# Patient Record
Sex: Male | Born: 1940
Health system: Southern US, Community
[De-identification: ages and names within clinical notes are randomized; demographics above are authoritative.]

## PROBLEM LIST (undated history)

## (undated) DIAGNOSIS — E785 Hyperlipidemia, unspecified: Secondary | ICD-10-CM

## (undated) DIAGNOSIS — I2510 Atherosclerotic heart disease of native coronary artery without angina pectoris: Secondary | ICD-10-CM

## (undated) DIAGNOSIS — Z9989 Dependence on other enabling machines and devices: Secondary | ICD-10-CM

## (undated) DIAGNOSIS — Z955 Presence of coronary angioplasty implant and graft: Secondary | ICD-10-CM

## (undated) DIAGNOSIS — G43909 Migraine, unspecified, not intractable, without status migrainosus: Secondary | ICD-10-CM

## (undated) DIAGNOSIS — I480 Paroxysmal atrial fibrillation: Secondary | ICD-10-CM

## (undated) DIAGNOSIS — E119 Type 2 diabetes mellitus without complications: Secondary | ICD-10-CM

## (undated) DIAGNOSIS — Z87448 Personal history of other diseases of urinary system: Secondary | ICD-10-CM

## (undated) DIAGNOSIS — N400 Enlarged prostate without lower urinary tract symptoms: Secondary | ICD-10-CM

## (undated) DIAGNOSIS — Z95820 Peripheral vascular angioplasty status with implants and grafts: Secondary | ICD-10-CM

## (undated) DIAGNOSIS — Z8719 Personal history of other diseases of the digestive system: Secondary | ICD-10-CM

## (undated) DIAGNOSIS — I10 Essential (primary) hypertension: Secondary | ICD-10-CM

## (undated) DIAGNOSIS — K219 Gastro-esophageal reflux disease without esophagitis: Secondary | ICD-10-CM

## (undated) DIAGNOSIS — Z8709 Personal history of other diseases of the respiratory system: Secondary | ICD-10-CM

## (undated) DIAGNOSIS — G4733 Obstructive sleep apnea (adult) (pediatric): Secondary | ICD-10-CM

## (undated) DIAGNOSIS — Z950 Presence of cardiac pacemaker: Secondary | ICD-10-CM

## (undated) HISTORY — PX: APPENDECTOMY: SHX54

## (undated) HISTORY — PX: CARDIAC ELECTROPHYSIOLOGY STUDY AND ABLATION: SHX1294

## (undated) HISTORY — PX: BACK SURGERY: SHX140

## (undated) HISTORY — PX: CHOLECYSTECTOMY: SHX55

## (undated) HISTORY — PX: PROSTATE SURGERY: SHX751

---
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
## 1994-05-24 HISTORY — PX: LUMBAR DISC SURGERY: SHX700

## 1998-12-04 ENCOUNTER — Emergency Department (HOSPITAL_COMMUNITY): Admission: EM | Admit: 1998-12-04 | Discharge: 1998-12-04 | Payer: Self-pay | Admitting: Emergency Medicine

## 1998-12-04 ENCOUNTER — Encounter: Payer: Self-pay | Admitting: Emergency Medicine

## 2000-06-21 ENCOUNTER — Encounter: Admission: RE | Admit: 2000-06-21 | Discharge: 2000-06-21 | Payer: Self-pay | Admitting: Cardiology

## 2000-06-21 ENCOUNTER — Encounter: Payer: Self-pay | Admitting: Cardiology

## 2000-06-24 DIAGNOSIS — Z955 Presence of coronary angioplasty implant and graft: Secondary | ICD-10-CM

## 2000-06-24 DIAGNOSIS — I10 Essential (primary) hypertension: Secondary | ICD-10-CM

## 2000-06-24 DIAGNOSIS — I2510 Atherosclerotic heart disease of native coronary artery without angina pectoris: Secondary | ICD-10-CM

## 2000-06-24 HISTORY — PX: CORONARY ANGIOPLASTY WITH STENT PLACEMENT: SHX49

## 2000-06-24 HISTORY — DX: Presence of coronary angioplasty implant and graft: Z95.5

## 2000-06-24 HISTORY — DX: Essential (primary) hypertension: I10

## 2000-06-24 HISTORY — DX: Atherosclerotic heart disease of native coronary artery without angina pectoris: I25.10

## 2000-06-28 ENCOUNTER — Inpatient Hospital Stay (HOSPITAL_COMMUNITY): Admission: RE | Admit: 2000-06-28 | Discharge: 2000-06-29 | Payer: Self-pay | Admitting: Cardiology

## 2000-07-31 ENCOUNTER — Encounter: Payer: Self-pay | Admitting: *Deleted

## 2000-07-31 ENCOUNTER — Inpatient Hospital Stay (HOSPITAL_COMMUNITY): Admission: EM | Admit: 2000-07-31 | Discharge: 2000-08-01 | Payer: Self-pay | Admitting: *Deleted

## 2001-02-03 ENCOUNTER — Encounter (INDEPENDENT_AMBULATORY_CARE_PROVIDER_SITE_OTHER): Payer: Self-pay | Admitting: Specialist

## 2001-02-03 ENCOUNTER — Ambulatory Visit (HOSPITAL_COMMUNITY): Admission: RE | Admit: 2001-02-03 | Discharge: 2001-02-03 | Payer: Self-pay | Admitting: Gastroenterology

## 2001-03-02 ENCOUNTER — Encounter: Payer: Self-pay | Admitting: Cardiology

## 2001-03-02 ENCOUNTER — Ambulatory Visit (HOSPITAL_COMMUNITY): Admission: RE | Admit: 2001-03-02 | Discharge: 2001-03-02 | Payer: Self-pay | Admitting: Cardiology

## 2001-06-02 ENCOUNTER — Encounter: Payer: Self-pay | Admitting: Emergency Medicine

## 2001-06-02 ENCOUNTER — Inpatient Hospital Stay (HOSPITAL_COMMUNITY): Admission: EM | Admit: 2001-06-02 | Discharge: 2001-06-05 | Payer: Self-pay | Admitting: Emergency Medicine

## 2001-08-05 ENCOUNTER — Emergency Department (HOSPITAL_COMMUNITY): Admission: EM | Admit: 2001-08-05 | Discharge: 2001-08-05 | Payer: Self-pay | Admitting: Emergency Medicine

## 2001-08-05 ENCOUNTER — Encounter: Payer: Self-pay | Admitting: Emergency Medicine

## 2001-12-13 ENCOUNTER — Encounter: Admission: RE | Admit: 2001-12-13 | Discharge: 2001-12-13 | Payer: Self-pay | Admitting: Cardiology

## 2001-12-13 ENCOUNTER — Encounter: Payer: Self-pay | Admitting: Cardiology

## 2002-10-16 ENCOUNTER — Encounter: Payer: Self-pay | Admitting: Cardiology

## 2002-10-16 ENCOUNTER — Ambulatory Visit (HOSPITAL_COMMUNITY): Admission: RE | Admit: 2002-10-16 | Discharge: 2002-10-16 | Payer: Self-pay | Admitting: Cardiology

## 2003-08-19 ENCOUNTER — Ambulatory Visit (HOSPITAL_COMMUNITY): Admission: RE | Admit: 2003-08-19 | Discharge: 2003-08-19 | Payer: Self-pay | Admitting: Urology

## 2003-08-19 ENCOUNTER — Ambulatory Visit (HOSPITAL_BASED_OUTPATIENT_CLINIC_OR_DEPARTMENT_OTHER): Admission: RE | Admit: 2003-08-19 | Discharge: 2003-08-19 | Payer: Self-pay | Admitting: Urology

## 2003-08-19 ENCOUNTER — Encounter (INDEPENDENT_AMBULATORY_CARE_PROVIDER_SITE_OTHER): Payer: Self-pay | Admitting: Specialist

## 2003-11-27 ENCOUNTER — Ambulatory Visit (HOSPITAL_BASED_OUTPATIENT_CLINIC_OR_DEPARTMENT_OTHER): Admission: RE | Admit: 2003-11-27 | Discharge: 2003-11-27 | Payer: Self-pay | Admitting: Urology

## 2003-11-27 ENCOUNTER — Encounter (INDEPENDENT_AMBULATORY_CARE_PROVIDER_SITE_OTHER): Payer: Self-pay | Admitting: *Deleted

## 2003-11-27 ENCOUNTER — Encounter (INDEPENDENT_AMBULATORY_CARE_PROVIDER_SITE_OTHER): Payer: Self-pay | Admitting: Specialist

## 2003-11-27 ENCOUNTER — Ambulatory Visit (HOSPITAL_COMMUNITY): Admission: RE | Admit: 2003-11-27 | Discharge: 2003-11-27 | Payer: Self-pay | Admitting: Urology

## 2003-12-17 ENCOUNTER — Inpatient Hospital Stay (HOSPITAL_COMMUNITY): Admission: EM | Admit: 2003-12-17 | Discharge: 2003-12-19 | Payer: Self-pay | Admitting: Emergency Medicine

## 2004-04-29 ENCOUNTER — Encounter (INDEPENDENT_AMBULATORY_CARE_PROVIDER_SITE_OTHER): Payer: Self-pay | Admitting: Specialist

## 2004-04-29 ENCOUNTER — Ambulatory Visit (HOSPITAL_COMMUNITY): Admission: RE | Admit: 2004-04-29 | Discharge: 2004-04-29 | Payer: Self-pay | Admitting: Gastroenterology

## 2005-01-29 IMAGING — CR DG CHEST 1V PORT
1 series · 1 of 1 positions shown · non-contrast
Comparison: none

CLINICAL DATA: Palpitations/atrial fibrillation.
 CHEST PORTABLE ONE VIEW
 Comparison 08/19/03.

 The heart size and mediastinal contours are within normal limits. The lungs are clear.
 IMPRESSION
 No acute disease.

[view not recorded]
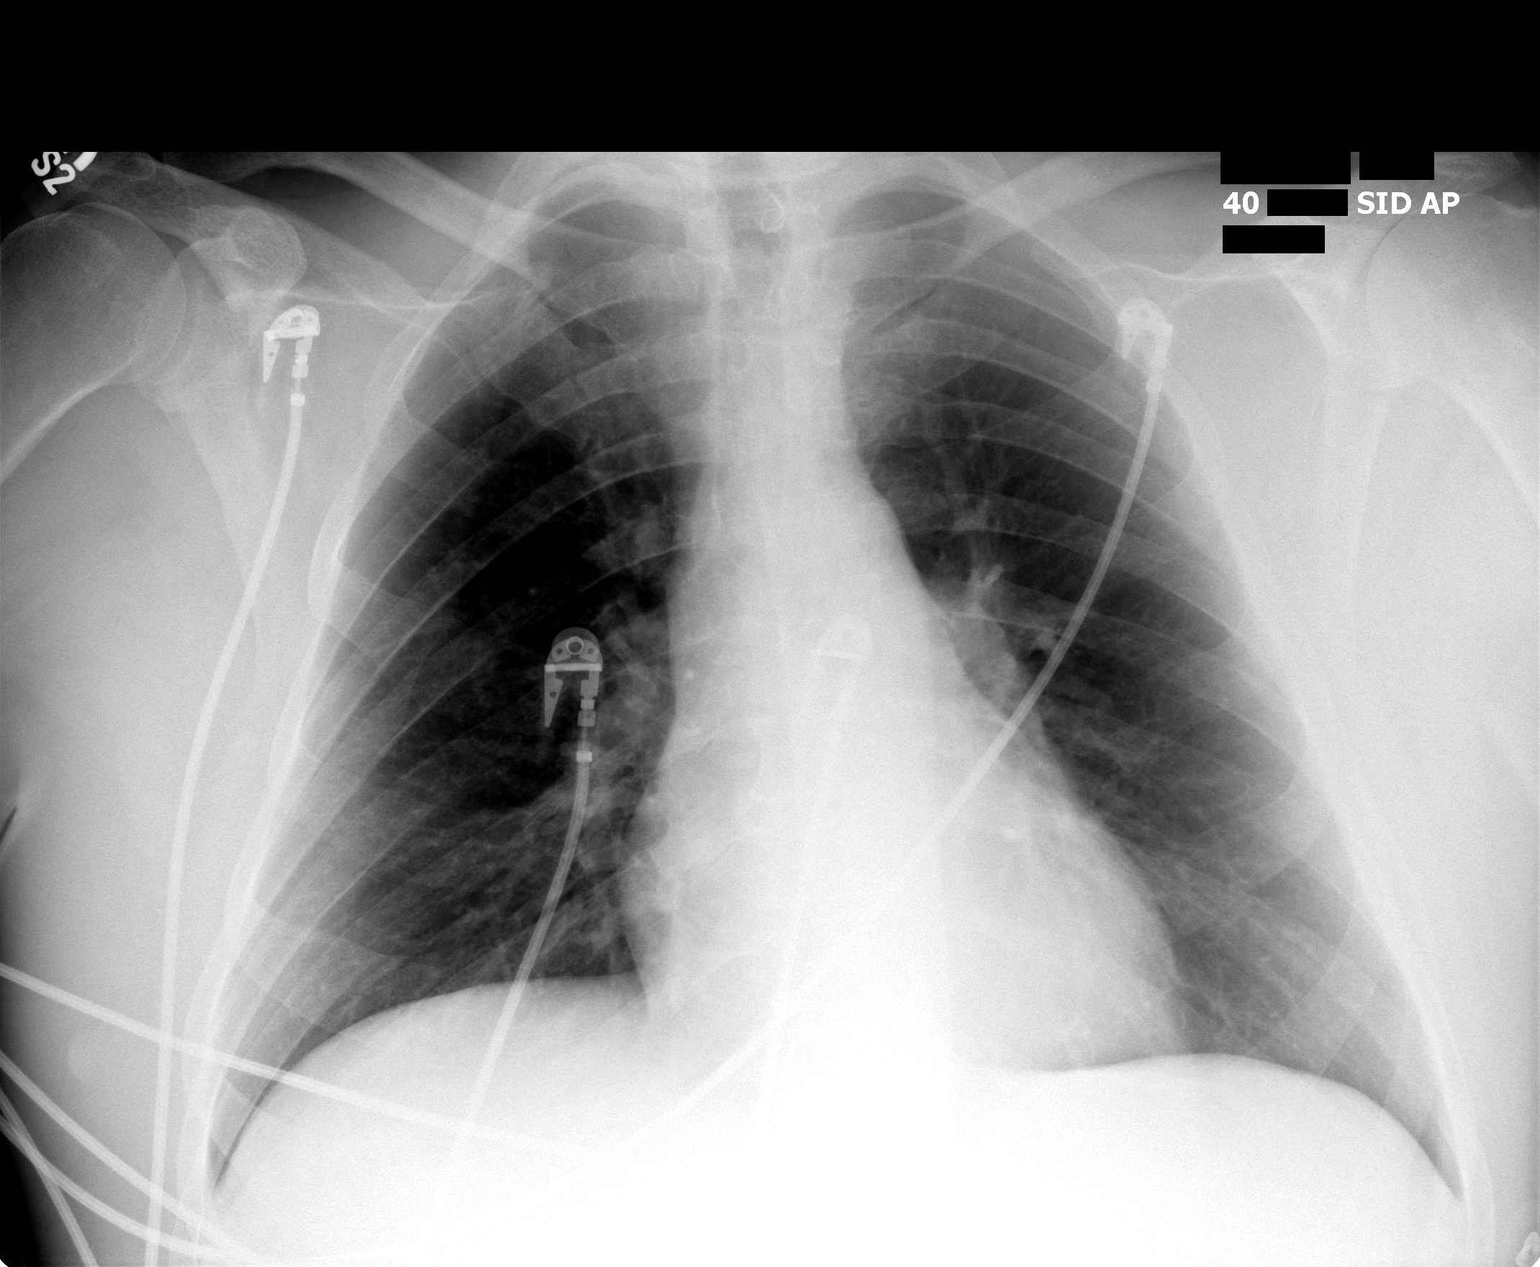

[1 of 1 positions shown; findings below may reference images not displayed]

## 2005-03-08 ENCOUNTER — Encounter: Admission: RE | Admit: 2005-03-08 | Discharge: 2005-03-08 | Payer: Self-pay | Admitting: Urology

## 2005-03-10 ENCOUNTER — Ambulatory Visit (HOSPITAL_BASED_OUTPATIENT_CLINIC_OR_DEPARTMENT_OTHER): Admission: RE | Admit: 2005-03-10 | Discharge: 2005-03-10 | Payer: Self-pay | Admitting: Urology

## 2005-03-10 ENCOUNTER — Encounter (INDEPENDENT_AMBULATORY_CARE_PROVIDER_SITE_OTHER): Payer: Self-pay | Admitting: *Deleted

## 2005-06-07 ENCOUNTER — Encounter (INDEPENDENT_AMBULATORY_CARE_PROVIDER_SITE_OTHER): Payer: Self-pay | Admitting: *Deleted

## 2005-06-07 ENCOUNTER — Ambulatory Visit (HOSPITAL_BASED_OUTPATIENT_CLINIC_OR_DEPARTMENT_OTHER): Admission: RE | Admit: 2005-06-07 | Discharge: 2005-06-07 | Payer: Self-pay | Admitting: Urology

## 2005-06-22 ENCOUNTER — Ambulatory Visit (HOSPITAL_COMMUNITY): Admission: RE | Admit: 2005-06-22 | Discharge: 2005-06-22 | Payer: Self-pay | Admitting: Cardiology

## 2007-06-18 ENCOUNTER — Inpatient Hospital Stay (HOSPITAL_COMMUNITY): Admission: EM | Admit: 2007-06-18 | Discharge: 2007-06-19 | Payer: Self-pay | Admitting: Emergency Medicine

## 2007-06-19 ENCOUNTER — Encounter (INDEPENDENT_AMBULATORY_CARE_PROVIDER_SITE_OTHER): Payer: Self-pay | Admitting: Cardiology

## 2008-01-21 ENCOUNTER — Emergency Department (HOSPITAL_COMMUNITY): Admission: EM | Admit: 2008-01-21 | Discharge: 2008-01-21 | Payer: Self-pay | Admitting: *Deleted

## 2010-05-24 HISTORY — PX: CATARACT EXTRACTION W/ INTRAOCULAR LENS  IMPLANT, BILATERAL: SHX1307

## 2010-10-06 NOTE — Discharge Summary (Signed)
NAMEWALTER, GRIMA NO.:  [id number]   MEDICAL RECORD NO.:  [id number]          PATIENT TYPE:  INP   LOCATION:  3711                         FACILITY:  [facility]   PHYSICIAN:  Antonieta Iba, MD   DATE OF BIRTH:  05-22-1941   DATE OF ADMISSION:  06/18/2007  DATE OF DISCHARGE:  06/19/2007                               DISCHARGE SUMMARY   [name] is a 70 year old white married male patient of Dr.  Julieanne Manson with known atrial fibrillation for many years.  He has  been on propafenone.  He is intolerant to amiodarone.  He came in with  breakthrough atrial flutter for approximately 4 days.  He was admitted  by Dr. Elsie Lincoln and put on IV heparin.  His CK-MBs and troponins were  negative.  His hemoglobin and other labs were stable.  He was seen by  Dr. Dossie Arbour on June 19, 2007.  His rate was controlled, and it was  decided to increase his propafenone from 150 mg q.i.d. to 300 mg t.i.d.  and put him on Lovenox to Coumadin at home.  The patient was taught by  nursing to give Lovenox injection, and he was ready to go home late in  the afternoon.   LABORATORY DATA:  Hemoglobin 13.6, hematocrit 39.8, WBC 8.6, and  platelets 219.  CK-MBs and troponins negative.  TSH was 2.043.  BNP was  less than 30.  His sodium was 135, potassium 4.2, BUN 18, creatinine  0.94, glucose was 167.   Chest x-ray not available in the computer at the time of this dictation.   DISCHARGE DIAGNOSES:  1. PA flutter with prior history of PA fib.  2. History of coronary artery disease.  3. Non-insulin-dependent diabetes mellitus.  4. Hypertension.  5. Hyperlipidemia.  6. History of hematuria when he was on Coumadin and 10 fish oil      capsules per day.  He has had an extensive workup.  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  No cancer.  He was told that he should be      put back on his Coumadin and stop all his fish oil and his extra      herbs at this time.  Also to note that  the patient is interested in      ablation if that is a consideration.  We will leave this for him to      discuss with Dr. Clarene Duke in the office since he has had 4 days of      being in flutter uncoagulated at this time.   DISCHARGE MEDICATIONS:  1. Flomax 0.4 mg a day.  2. Januvia 100 mg a day.  3. Ocuvite one-half tablet in the morning and in the p.m.  4. Aspirin 81 mg a day.  5. Lisinopril 20 mg twice a day.  6. Lanoxin 250 mcg a day.  7. Crestor 20 mg a day.  8. Norvasc 10 mg a day.  9. Prilosec 20 mg a day.  10.Glucotrol 5 mg twice a day.  11.Actos  45 mg a day.  12.Metformin 1000 mg twice a day.  13.Sertraline 50 mg a day.  14.Micardis 80 mg a day.  15.Tekturna 150 mg a day.  16.He should increase his propafenone to 300 mg 3 times per day.  17.He should start Lovenox 100 mg subcu every 12 hours, such as 6:00      a.m. and 6:00 p.m.  18.Start warfarin 5 mg daily.   FOLLOW UP:  He will [facility]up [facility]  [facility].   ACTIVITY:  He has no activity restrictions.   DIET:  He should be on a diabetic diet.      Lezlie Octave, N.P.      Antonieta Iba, MD  Electronically Signed    BB/MEDQ  D:  06/19/2007  T:  06/20/2007  Job:  [id number]   cc:   Caryn Bee L. Little, M.D.

## 2010-10-09 NOTE — Op Note (Signed)
NAMESEBRON, MCMAHILL                 ACCOUNT NO.:  [id number]   MEDICAL RECORD NO.:  [id number]          PATIENT TYPE:  AMB   LOCATION:  ENDO                         FACILITY:  [facility]   PHYSICIAN:  Petra Kuba, M.D.    DATE OF BIRTH:  June 19, 1940   DATE OF PROCEDURE:  04/29/2004  DATE OF DISCHARGE:                                 OPERATIVE REPORT   PROCEDURE:  Colonoscopy with biopsy.   INDICATION:  Patient with history of colon polyps, due for repeat screening.  Consent was signed after risks, benefits, methods, options thoroughly  discussed in the office.   MEDICINES USED:  Demerol 100, Versed 12.5.   PROCEDURE:  Rectal inspection is pertinent for external hemorrhoids.  Small  digital exam was negative.  Video colonoscope was inserted and fairly easily  advanced around the colon to the mid ascending colon.  At that point there  was some looping.  Abdominal pressure was unsuccessful so we rolled him on  his back and was able to advance to the cecal pole which was identified by  the appendiceal orifice and ileocecal valve.  No abnormality was seen on  insertion.  The scope was slowly withdrawn.  The prep was fairly adequate.  It required over a liter of washing and suctioning for adequate  visualization but no slow withdrawal through the colon, other than a tiny  mid descending polyp which was cold biopsied x2, no other abnormalities were  seen as we slowly withdrew back to the rectum.  Anorectal pull through on  retroflexion confirmed some small hemorrhoids.  The scope was straightened  and readvanced a short ways up the left side of the colon.  Air was  suctioned and scope removed.  Patient tolerated the procedure well.  There  was no obvious immediate complication.   ENDOSCOPIC DIAGNOSES:  1.  Internal/external hemorrhoids.  2.  Tiny descending polyp cold biopsied.  3.  Otherwise within normal limits to the cecum.   PLAN:  Await pathology.  Probably recheck on screening in 5  years.  Happy to  see back p.r.n.  Otherwise, return care to Dr. Clarene Duke for the customary  healthcare maintenance to include yearly rectals and guaiacs.       MEM/MEDQ  D:  04/29/2004  T:  04/29/2004  Job:  [id number]   cc:   Petra Kuba, M.D.  [address]  [place]  [place] [address]  Fax: [phone]   Anna Genre. Little, M.D.  [address]  [place]  [place] [address]  Fax: [phone]

## 2010-10-09 NOTE — H&P (Signed)
[place]  Patient:    [name], [name] Visit Number: [id number] MRN: [id number]          Service Type: EMS Location: [facility] [place] Attending Physician:  Lorre Nick Dictated by:   Delrae Rend, M.D. Admit Date:  08/05/2001 Discharge Date: 08/05/2001   CC:         Julieanne Manson, M.D.   History and Physical  CHIEF COMPLAINT:  Chest pain.  This is an emergency room evaluation and the patient is being discharged home.  HISTORY OF PRESENT ILLNESS:  [name] is a 70 year old gentleman with a past medical history of coronary artery disease, status post PTCA and stenting of the left anterior descending coronary artery in February 2002, a widely patent stent in October and November 2002, by a cardiac catheterization, with no other significant coronary artery disease.  A history of paroxysmal atrial fibrillation, now maintaining sinus rhythm on Rythmol.  He has hypertension and diabetes mellitus and an anxiety disorder.  He presents with chest pain.  The patient states that he has been having chest pain for the last two to three weeks; however, in the last one week he has noticed occasional episodes of chest pain.  He points his fingers to the front of the chest just medial to his nipple, and states that extending his neck upwards would reproduce his pain.  He has been having this kind of pain with four to five episodes today. Since he feels that this could be related to  uncontrolled blood pressure and his anxiety level, as seemingly has increased on Wellbutrin treatments, he thought he would get it checked out in the emergency room.  He states he is presently asymptomatic.  He has not had any more episodes of chest pain since 4 p.m. this evening.  He denies any shortness of breath.  He denies any palpitations.  Denies any recent episodes of palpitations or dizziness.  PAST MEDICAL HISTORY:  As dictated above.  REVIEW OF SYSTEMS:  He denies any  bowel or bladder symptoms.  He denies any neurological weaknesses.  He denies any recent weight gain or weight loss.  He denies any palpitations.  He denies any syncope.  Other systems are negative.  FAMILY HISTORY:  There is no history of premature coronary artery disease in the family.  SOCIAL HISTORY:  He is married.  He does not drink alcohol.  Does not smoke.  CURRENT MEDICATIONS  1. Lisinopril 20 mg p.o. b.i.d.  2. Glucotrol 5 mg p.o. b.i.d.  3. Actos 45 mg p.o. q.d.  4. Lipitor 30 mg p.o. q.d.  5. Bufferin 2.5 mg p.o. q.d.  6. Lanoxin 0.25 mg p.o. q.d.  7. Metformin 5 mg p.o. b.i.d.  8. Prilosec 20 mg p.o. q.d.  9. ______ 25 mg p.o. q.d. 10. Rythmol 150 mg p.o. t.i.d. 11. Wellbutrin 150 mg p.o. b.i.d. 12. Norvasc 5 mg p.o. q.d.  ALLERGIES:  No known drug allergies.  PHYSICAL EXAMINATION  GENERAL:  He is well-built and mildly obese.  He appears in no acute distress.  VITAL SIGNS:  Temperature 98.3 degrees, pulse 70 beats per minute, regular, respirations 14, blood pressure 148/76 mmHg.  CARDIAC:  S1 and S2 normal.  There is no gallop, no murmur.  CHEST:  Bilateral good breath sounds.  No crackles.  ABDOMEN:  Benign.  Bowel sounds heard in all quadrants.  EXTREMITIES:  No edema.  PERIPHERAL VASCULAR:  Normal.  PERTINENT FINDINGS:  Electrocardiogram showed a normal sinus rhythm, normal  axis.  There is no evidence of ischemia.  CBC is within normal limits.  Electrolytes are within normal limits.  BUN 14, creatinine 0.8.  His CPK and troponin is negative for a myocardial injury. Digoxin is 0.9.  IMPRESSION 1. Atypical chest pain. 2. Hypertension, uncontrolled. 3. Diabetes mellitus. 4. Anxiety disorder.  RECOMMENDATION:  The patient can be discharged home.  I have again explained to him that there is a small possibility of a coronary event; however, my thought process is that it is very unlikely that he would be considered a high risk.  He has easily  reproducible chest pain.  The physical examination is negative.  The electrocardiogram is negative.  CPK negative.  Hence, the patient will be discharged home.  Will increase his Norvasc to 10 mg p.o. q.d. for better control of hypertension.  I have given him a prescription for sublingual nitroglycerin, and advised him how to use it.  I also told him to call me directly through our answering service if he has any recurrent chest pains, so I can make arrangements for his direct admission to the hospital.  The patient will follow up with Dr. Julieanne Manson.Dictated by:   Delrae Rend, M.D.  Attending Physician:  Lorre Nick DD:  08/05/01 TD:  08/07/01 Job: [id number]

## 2010-10-09 NOTE — Op Note (Signed)
NAMETETSUO, COPPOLA                           ACCOUNT NO.:  [id number]   MEDICAL RECORD NO.:  [id number]                   PATIENT TYPE:  AMB   LOCATION:  NESC                                 FACILITY:  [facility]   PHYSICIAN:  Maretta Bees. Vonita Moss, M.D.             DATE OF BIRTH:  August 16, 1940   DATE OF PROCEDURE:  11/27/2003  DATE OF DISCHARGE:                                 OPERATIVE REPORT   PREOPERATIVE DIAGNOSES:  Recurrent hematuria and positive NMP22.   POSTOPERATIVE DIAGNOSES:  Recurrent hematuria and positive NMP22.   PROCEDURE:  1. Cystoscopy.  2. Bilateral saline washings from renal pelves.  3. Bilateral retrograde pyelogram with interpretation.  4. Cold cup bladder biopsies and fulguration of blood vessels and then     trigone of bladder neck.   SURGEON:  Dr. Larey Dresser   ANESTHESIA:  General.   INDICATIONS:  This 70 year old white male has had some bouts of recurrent  gross hematuria and has had a CT scan which showed a cyst of the left kidney  and an ultrasound confirming that.  No other lesions were noted.  He had  NMP22s that have been positive, and he underwent cystoscopy, bilateral  retrograde pyelograms with interpretation, cold cup bladder biopsy in March  of this year which were negative.  He is continuing to have some problems  with gross hematuria and persistently positive NMP22, and he is brought to  the OR today for further evaluation.  He has been on Coumadin before but is  not on that now.   DESCRIPTION OF PROCEDURE:  The patient was brought to the operating room and  placed in lithotomy position.  The external genitalia were prepped and  draped in the usual fashion.  He was cystoscoped.  The anterior urethra was  totally normal with no lesions whatsoever, and the prostate had bilateral  lobar hypertrophy but no abnormalities in the mucosa, although there was  some increased vascularity at the bladder neck which was not pathologic.  Just inside the  bladder neck on the trigone, there were some abnormally  large superficial blood vessels that did not look inflammatory and just  variation of normal.  The rest of the bladder had trabeculation but no  stones, tumors, or inflammatory lesions.  A #5 [place] whistle-tip ureteral  catheter was placed up the right ureter without difficulty and saline  washings obtained from the renal pelvis for cytology.  I then did a right  retrograde pyelogram, and the pyelocaliceal system was delicate,  unobstructed, and without any filling defects, and the distal ureter was  unremarkable.  I then placed a 5 [place] whistle-tip catheter up the left  side, and there was a slight hang-up for a few seconds, later felt to be due  to some distal J-hooking and later felt to be due to some J-hooking in the  distal left ureter.  I then did saline washings  from the left renal pelvis  for cytology.  I injected contrast, and no filling defects were seen.  There  was slight extravasation of contrast into some superficial blood vessels.  The ureter was unremarkable except for J-hooking of the distal left ureter.  This is noted on retrograde pyelogram on the left side.  Looking back in the  bladder, there were at this point some submucosal petechiae scattered  through the bladder, so I used the cold cup bladder biopsy forceps and took  a biopsy from the right wall, the dome, and the left wall and also from the  trigone.  These biopsy sites were then fulgurated with the Bugbee electrode.  Then fulgurated the large blood vessels in the mucosa of the trigone and  bladder neck with the thought that they may be the cause of his gross  hematuria.  At this point, there was good hemostasis, no significant blood  loss whatsoever.  The bladder was emptied, the scope removed, and the  patient sent to the recovery room in good condition having tolerated the  procedure well.                                               Maretta Bees.  Vonita Moss, M.D.    LJP/MEDQ  D:  11/27/2003  T:  11/27/2003  Job:  [id number]   cc:   Thereasa Solo. Little, M.D.  [address]  [address] [place] [address]  [place]  [place] [address]  Fax: [phone]   Anna Genre. Little, M.D.  [address]  [place]  [place] [address]  Fax: [phone]

## 2010-10-09 NOTE — H&P (Signed)
[place]  Patient:    [name], [name] Visit Number: [id number] MRN: [id number]          Service Type: MED Location: [facility] 2902 [id number] Attending Physician:  Loreli Dollar Dictated by:   Delrae Rend, M.D. Admit Date:  06/02/2001 Discharge Date: 06/05/2001   CC:         Julieanne Manson, M.D.  Anna Genre Little, M.D.   History and Physical  PRIMARY CARDIOLOGIST:  Julieanne Manson, M.D.  REFERRING PHYSICIAN:  Anna Genre. Little, M.D.  CHIEF COMPLAINT:  Palpitations.  IMPRESSION: 1. Paroxysmal atrial fibrillation/atrial flutter.  Now the patient is    presenting with breakthrough atrial flutter with variable block.  The    initial presentation was with atrial flutter with 2:1 conduction at a rate    of 140 beats per minute.  This was associated with symptoms of hypotension. 2. History of coronary artery disease, status post percutaneous transluminal    coronary angioplasty and stenting of the left anterior descending artery in    February of 2002.  Widely patent stent sometime in October of 2002 by    cardiac catheterization. 3. Hypertension.  Presently an episode of hypotension with atrial fibrillation    with a rapid ventricular response. 4. Diabetes mellitus.  RECOMMENDATIONS: 1. The breakthrough atrial flutter could probably be secondary to decreasing    the dose of Rythmol.  Initially the patient was on 150 mg t.i.d. of    Rythmol, which was reduced to 150 mg p.o. b.i.d.  However, coronary    ischemia leading to breakthrough atrial flutter cannot be complete    excluded. 2. Will increase the Rythmol to 225 mg p.o. t.i.d.  Will check the serum    potassium and serum magnesium.  Will also start the patient on a low dose    of beta blockade, including Lopressor 25 mg p.o. b.i.d. 3. Will check CPKs and troponins.  This is to evaluate for any evidence of    myocardial ischemia. 4. Continue with his previous medications. 5. If the patient  spontaneously converts with Rythmol, the patient can    potentially be discharged home in 24-48 hours.  Otherwise either a planned    electrical cardioversion on an outpatient basis or on an inpatient basis    can be contemplated.  Further recommendations to follow.  HISTORY OF PRESENT ILLNESS:  [name] is a 70 year old white male with a past medical history of coronary artery disease, status post PTCA and stenting of the left anterior descending artery and a history of atrial fibrillation.  He was on chronic Coumadin therapy and also on chronic Rythmol therapy.  He was doing well until a couple of days ago when he had gone to [place] and [employer] where he had occasional episodes of palpitations.  However, last night while in the hotel, he had rapid onset of palpitations.  This lasted the whole night.  This morning he did not want to go to the emergency room in [place]. Hence, his daughter drove him to [place].  He presents to the hospital complaining of palpitations, lightheadedness, and dizziness.  In the emergency room while he was evaluated, his systolic blood pressure dropped down as low as 50 mmHg.  This spontaneously reverted to around 90-100 mmHg with intravenous normal saline and putting the patient in Trendelenburg position.  At this time, the patient is asymptomatic and is lying comfortably in bed and denies any palpitations or chest pain.  REVIEW  OF SYSTEMS:  He denies any bowel or bladder disturbance.  He denies any neurological weaknesses, except for dizziness that just started late this evening.  He did have some palpitations and associated dizziness last night, however, he felt better by this morning.  He denies any loss of consciousness. Denies any convulsions or seizure activity.  He denies any recent chest pain or lower extremity edema.  Other systems were negative.  PAST MEDICAL HISTORY:  Significant for coronary artery disease, hypertension, and  diabetes mellitus.  His past medical history also includes cardiac catheterization on January 26, 2001, where he underwent successful PTCA and stenting of the proximal left anterior descending artery.  This was a 3.0 x 15 mm Nir stent.  He has had repeat cardiac catheterization in late 2002 in which he was found to have patent stent.  PAST SURGICAL HISTORY:  He has had back surgery in the 1970s.  He has had cholecystectomy in the 1970s.  FAMILY HISTORY:  There is no history of premature coronary artery disease in the family.  Both his parents lived into their late 6s to 71s.  He has no brothers or sisters.  SOCIAL HISTORY:  He is married.  He does not drink alcohol.  He does not smoke.  PRESENT MEDICATIONS: 1. Lisinopril 10 mg p.o. q.d. 2. Glucotrol 5 mg p.o. b.i.d. 3. Actos 45 mg p.o. q.d. 4. Rythmol 150 mg p.o. b.i.d., which has been reduced from t.i.d. a month    ago. 5. Coumadin 5 mg p.o. q.d., except on Sunday and Wednesday where he takes    2.5 mg p.o. q.d. 6. Lanoxin 0.25 mg p.o. b.i.d. 7. Metformin 5 mg p.o. b.i.d. 8. Prilosec 20 mg p.o. q.d.  ALLERGIES:  No known drug allergies.  PHYSICAL EXAMINATION:  He is well built and moderately obese.  He appears to be in no acute distress.  VITAL SIGNS:  Heart rate 56 beats per minute and irregular, respirations 14, blood pressure 104/64 mmHg.  CARDIAC:  S1 is variable.  S2 is normal.  Distant heart sounds secondary to obesity.  No gallop or murmur appreciated.  CHEST:  Bilaterally equal breath sounds.  No wheeze.  ABDOMEN:  Examination was benign.  Bowel sounds heard in all quadrants.  No obvious organomegaly.  EXTREMITIES:  No edema.  The peripheral vascular exam was grossly normal.  LABORATORY DATA:  Review of his telemetry rhythm strip reveal atrial fibrillation with 2:1 conduction with rapid ventricular response.  With  Adenocard infusion underlying atrial flutter waves could be easily demonstrated.  Other  pertinent findings included his CBC within normal limits with a hemoglobin of 15.8 and a hematocrit of 48.3.  His pro time/INR was 3.0 and therapeutic.  The CPK total was 42 with an MB of 1.1.  The troponin was negative for myocardial injury at 0.03.  The UA was normal.  Other labs are pending at this time.  His EKG demonstrates underlying atrial fibrillation/atrial flutter with rapid ventricular response at a rate of 135 beats per minute with nonspecific ST-T wave changes.  After slowing his heart rate, there is underlying atrial fibrillation with controlled ventricular response, but no evidence of ischemia. Dictated by:   Delrae Rend, M.D. Attending Physician:  Loreli Dollar DD:  06/02/01 TD:  06/03/01 Job: [id number]

## 2010-10-09 NOTE — H&P (Signed)
[name], [name]                           ACCOUNT NO.:  [id number]   MEDICAL RECORD NO.:  [id number]                   PATIENT TYPE:  INP   LOCATION:  1823                                 FACILITY:  [facility]   PHYSICIAN:  Thereasa Solo. Little, M.D.              DATE OF BIRTH:  [birth year]   DATE OF ADMISSION:  12/17/2003  DATE OF DISCHARGE:                                HISTORY & PHYSICAL   CHIEF COMPLAINT:  Rapid heart rate.   REFERRING PHYSICIAN:  Anna Genre. Little, M.D., primary care.  Maretta Bees.  Vonita Moss, M.D., urology.   HISTORY OF PRESENT ILLNESS:  The patient is a 70 year old white male status  post PTCA to the LAD February of 2000.  He has a history of PAF which has  been stable on Rhythmol.  In July he underwent a cystoscopy in evaluation  for hematuria and had recurrent UTI's requiring antibiotics and self  catheterizations through December 11, 2003.  He is now back to normal and is  voiding on his own.  He has been off Coumadin secondary to this for over  three weeks.  The patient relates awakening three nights in a row with  sweats.  He had no shortness of breath.  There was no PND and no chest pain.  Last night, he noted that his heart rate was up and staying up.  He took  alprazolam and then he took another one this morning.  His heart rate  persisted.  The patient relates his heart rate up to as high as 130 to 140.  He subsequently came to the ER at [facility] and found to be in  atrial flutter with a heart rate ranging between 120 to 140.  He has had  previous episodes of atrial fib/flutter, but they do not usually last more  than an hour and he goes back to sinus rhythm.  He occasionally can feel his  heart being irregular.  This time he just noticed his heart rate was fast.  In the ER, he was treated with some IV Cardizem to slow his rate down.  He  took his first dose of Coumadin today, 5 mg.  Currently, his heart rate is  between the 70's and 80's in atrial  flutter.  There is no chest pain and no  shortness of breath.  WE plan to admit the patient, place him on heparin,  Coumadin crossover, decide a course for rhythm control, and rule out MI.   PAST MEDICAL HISTORY:  Adult onset diabetes mellitus since 1996.  He had his  tonsils and adenoids removed in 1949.  He has a history of hyperlipidemia,  hypertension, positive hiatal hernia, cardiac catheterization with LAD stent  in February of 2002 with an NIR stent.  Negative cardiac catheterization in  October and November of 2002.  Negative Cardiolite for ischemia, EF 57% on  Oct 16, 2002.  History of prostatitis since February of 2005 with hematuria,  UTI in July of 2005.  Cystoscopy with retrograde pyelogram and biopsy on  November 27, 2003, by Maretta Bees. Vonita Moss, M.D.  Status post cholecystectomy and  appendectomy.   ALLERGIES:  DOPAMINE causes a major tachycardia.  HORSE SERUM TETANUS.   HABITS:  EtOH; none.  Cigarettes; he has never smoked.  Drugs; none.   CURRENT MEDICATIONS:  1. Lipitor 30 mg h.s.  2. Lisinopril 10 mg b.i.d.  3. Lanoxin 250 mcg daily.  4. Atenolol 50 mg daily.  5. Rhythmol 150 mg t.i.d.  6. Warfarin 5 mg Monday and Wednesday, 2.5 mg     Tuesday/Thursday/Saturday/"Sunday/Friday.  7. Actos 45 mg daily.  8. Metformin 1 gram in a.m., 1 gram in p.m. a.c.  9. Norvasc 10 mg daily.  10.      Prilosec 20 mg daily.  11.      Glucotrol 5 mg b.i.d. a.c.  12.      Tricor 160 mg daily.  13.      Micardis 80 mg daily.  14.      Keflex 500 mg q.6h.  15.      Flomax 0.4 mg one in the a.m. and p.m.  16.      Alprazolam unsure of dosage p.r.n.   SOCIAL HISTORY:  He has been married for 40 years.  He works as a school  principal.   FAMILY HISTORY:  Father died at 78 with myocardial infarction and COPD.  Mother died at age 87 with cancer and Alzheimer's disease.  No brothers, no  sisters, three kids all in good health.  He has a grandfather with a history  of diabetes.   REVIEW OF  SYSTEMS:  CV:  He had a history of migraines in the past, but  these resolved.  He has some occasional dizziness.  PULMONARY:  No symptoms.  CARDIAC:  Positive palpitations, no chest pain or pressure tightness.  GASTROINTESTINAL:  Positive for GERD, occasional diarrhea recently.  GENITOURINARY:  Okay now, voided on his own since about December 11, 2003.  EXTREMITIES:  Lower extremities with no edema or claudication.   PHYSICAL EXAMINATION:  GENERAL:  This is a well-nourished, well-developed,  white male in no acute distress.  VITAL SIGNS:  Blood pressure is 138/69, heart rate on admission was 120,  saturations were 98% on 2 liter nasal cannula, temperature 98.3.  HEENT:  Normocephalic and atraumatic.  PERRLA.  Extraocular muscles intact.  Fundi not visualized.  Ears, nose, throat, and mouth grossly within normal  limits.  NECK:  No bruits, no JVD, no thyromegaly.  CHEST:  Clear to auscultation and percussion.  HEART:  Irregular, no murmurs or rubs.  GASTROINTESTINAL:  Soft and nontender, positive bowel sounds, no  hepatosplenomegaly.  GENITOURINARY:  RECTAL:  Not medically indicated.  EXTREMITIES:  Lower extremities with no edema.  NEUROLOGY:  No focal changes.   IMPRESSION:  1. Paroxysmal atrial fibrillation with new atrial flutter, rapid ventricular     response, normally on Coumadin.  2. Coronary artery disease status post left anterior descending stent in     20" 02.  3. Adult onset diabetes mellitus non-insulin dependent.  4. Hiatal hernia.  5. Hyperlipidemia.  6. Prostatitis.   PLAN:  Admit to telemetry for rate control, rule out MI, and heparin to  Coumadin crossover.      Eber Hong, P.A.  Thereasa Solo. Little, M.D.    WDJ/MEDQ  D:  12/17/2003  T:  12/17/2003  Job:  [id number]   cc:   Caryn Bee L. Little, M.D.  [address]  [place]  [place] [address]  Fax: [phone]   Maretta Bees. Vonita Moss, M.D. [address]  [place]  [place]  [address]  Fax: [phone]

## 2010-10-09 NOTE — Cardiovascular Report (Signed)
[name]. [facility]  Patient:    [name], [name]                        MRN: [id number] Proc. Date: 06/28/00 Adm. Date:  [id number] Attending:  Loreli Dollar CC:         Anna Genre. Little, M.D.  Cardiac Catheterization Laboratory   Cardiac Catheterization  INDICATIONS FOR PROCEDURE:  [name] is a 70 year old male who had a catheterization in 1988 showing mild CAD.  He had been managed medically.  He had a nuclear study performed that was strongly positive for anterior and apical ischemia.  PROCEDURES: 1. Left heart catheterization. 2. Selective right and left coronary arteriography. 3. Ventriculography in the right anterior oblique projection. 4. Stenting to the proximal left anterior descending.  COMPLICATIONS:  None.  DISCHARGE STATUS:  Pain-free.  DESCRIPTION OF PROCEDURE:  The patient was prepped and draped in the usual sterile fashion exposing the right groin.  Following local anesthetic with 1% Xylocaine, the Seldinger technique was employed and a 6 [place] introducer sheath was placed in the right femoral artery.  Selective right and left coronary arteriography and ventriculography in the RAO projection was performed.  The 6 French Judkins configuration catheters were used.  RESULTS: 1. Hemodynamic monitoring:  Central aortic pressure 135/66, left    ventricular pressure 135/14 with no aortic valve gradient noted at the    time of pullback. 2. Ventriculography:  Ventriculography in the RAO projection revealed    normal left ventricular systolic function with ejection fraction    between 55-60%.  The end-diastolic pressure was 15.  There was no    mitral regurgitation.  CORONARY ARTERIOGRAPHY:  There was dense calcification noted in the proximal portion of the LAD. 1. Left main:  Normal. 2. LAD.  The most proximal portion of the LAD just distal to the ostium was    99% narrowed.  The midportion of the LAD had an area of 40%  narrowing.    There were two diagonals that were free of disease.  The first diagonal    came off just distal to the obstruction. 3. Circumflex:  The circumflex gave rise to two large OMs.  This system had    only minimal irregularities proximally. 4. Right coronary artery:  The right coronary artery is a dominant vessel with    only minimal irregularities.  CONCLUSIONS: 1. Normal left ventricular systolic function. 2. High-grade stenosis in the most proximal portion of the left anterior    descending.  After discussing this with the patient, we proceeded on with intervention.  A 7 French introducer sheath was placed where the 6 [place] had previously been placed.  A 3.5 JL4 guide catheter was used, 182 cm luge and a 2.5 x 10 CrossSail balloon was used.  The wire was placed down the LAD and the CrossSail balloon placed in a manner that it was not in the left main.  A single predilatation for stenting was done at 8 atmospheres for 55 seconds. Following this, a 3.0 x 15 mm NIR stent was placed in such a manner that it was not in the left main and was not in the ostium of the LAD but covered the proximal and the distal portion of the lesion.  It was initial deployed at 10 atmospheres for 64 seconds with the final inflation being 11 atmospheres for 66 seconds.  After the stent had been deployed, the vessel appeared to be  hyperexpanded in the area of the stent.  There was no evidence of any dissection or thrombus.  There was brisk distal TIMI-3 flow.  During the inflation the patient had chest tightness.  This resolved with deflating the balloon and at the time when he left the catheterization lab he was pain-free.  During the procedure he received 4 mg of IV Versed, heparin 5700 units IV, Integrilin double bolus; this will be maintained for 12 hours.  His ACT at the termination of the procedure was 326.  He should be ready for discharge in the morning. DD:  06/28/00 TD:  06/29/00 Job:  [id number]

## 2010-10-09 NOTE — Discharge Summary (Signed)
[place]. [facility]  Patient:    [name], [name]                        MRN: [id number] Adm. Date:  [id number] Disc. Date: [id number] Attending:  Loreli Dollar CC:         Anna Genre. Little, M.D.   Discharge Summary  ADMISSION DIAGNOSIS:  Coronary artery disease.  DISCHARGE DIAGNOSES: 1. Coronary artery disease. 2. Hypertension. 3. Hyperlipidemia. 4. Diabetes mellitus. 5. Mild depression. 6. History of supraventricular tachycardia.  PROCEDURES: 1. Left heart catheterization. 2. Stent placement to proximal LAD.  COMPLICATIONS:  None.  HISTORY OF PRESENT ILLNESS:  See note on chart but, briefly, [name] is a 70 year old male who has mild coronary artery disease by catheterization approximately five years ago.  He had a stress test performed June 14, 2000, that showed anterior apical ischemia.  He was brought in for outpatient cardiac catheterization.  He is obese.  Lungs clear.  Cardiac regular rhythm. No edema.  HOSPITAL COURSE:  The patient was brought in as an outpatient for cardiac catheterization.  His catheterization revealed a 99%, very proximal obstruction in the LAD, with a 40% area in the midportion of the LAD.  The remainder of the vessels had only minimal irregularities.  He had a normal ejection fraction at 58%.  Because of the high-grade stenosis in the LAD, he underwent urgent angioplasty and stenting.  A 3.0 x 15 NIR stent was placed into his proximal LAD after predilatation with a CrossSail balloon.  He was treated with IV heparin, 12 hours of IV Integrilin.  His EKG following the procedure was normal.  His cardiac enzymes postprocedure were normal, and his BUN and creatinine at the time of discharge are 11 and 0.9.  While in the recovery room he had an episode of chest discomfort with mild hypotension and became diaphoretic.  His EKG with pain was unremarkable.  This resolved with 0.5 mg of atropine.  He has had no  recurrence.  He has currently been ambulated in the hall without shortness of breath.  His catheterization site is well-healed, and he is felt to be stable and ready for discharge.  MEDICATIONS:  He will be on:  1. Plavix for 30 days.  2. Aspirin once a day.  3. Imdur 30 mg once a day.  4. Atenolol 50 mg b.i.d.  5. Lipitor 20 mg 1-1/2 tablets a day.  6. Prozac 20 mg a day.  7. Lanoxin 0.25 mg 2 a day.  8. Glucotrol 5 mg b.i.d.  9. Zestril 5 mg once a day. 10. Prilosec 20 mg once a day. 11. Actos 45 mg once a day. 12. Nitroglycerin p.r.n. 13. Ocuvite.  ACTIVITY:  Limited for 48 hours.  DIET:  Instructed on a low salt, low fat diet.  FOLLOW-UP:  I plan to reevaluate him in two to three weeks.  He will need a stress test in six months since he was basically asymptomatic at the time of his prior stress test. DD:  06/29/00 TD:  06/30/00 Job: [id number]

## 2010-10-09 NOTE — Op Note (Signed)
NAMECATARINO, VOLD                 ACCOUNT NO.:  [id number]   MEDICAL RECORD NO.:  [id number]          PATIENT TYPE:  AMB   LOCATION:  [facility]                         FACILITY:  [facility]   PHYSICIAN:  Maretta Bees. Vonita Moss, M.D.DATE OF BIRTH:  November 24, 1940   DATE OF PROCEDURE:  03/10/2005  DATE OF DISCHARGE:                                 OPERATIVE REPORT   PREOPERATIVE DIAGNOSES:  Hematuria and bladder neck contracture.   POSTOPERATIVE DIAGNOSES:  Hematuria and bladder neck contracture.   PROCEDURE:  Cystoscopy, bilateral retrogrades pyelograms with  interpretation, cold cup bladder biopsies and fulguration of the trigone and  bladder neck.   SURGEON:  Maretta Bees. Vonita Moss, M.D.   ANESTHESIA:  General.   INDICATIONS FOR PROCEDURE:  This 70 year old gentleman has had a long  history of bladder neck contracture treated with Flomax and a history of  recurrent hematuria. He takes Coumadin. He has had a complete workup before  because of recurrent bleeding and he is brought to the OR today for further  evaluation.   DESCRIPTION OF PROCEDURE:  The patient was brought to the operating room,  placed in lithotomy position, external genitalia were prepped and draped in  the usual fashion. He was cystoscoped, the anterior and prostatic urethra  were unremarkable except for some increased vascularity at the bladder neck  at 6 o'clock. There was an increased area of vascularity on the trigone that  was not felt to be pathologic but may contribute to the bleeding. The rest  of the bladder was totally normal with no stones, tumors or inflammatory  lesions.   Bilateral retrograde pyelograms were obtained using the cone-tip catheter  and the ureters were delicate and nonobstructed. The pyelocaliceal systems  were normal with no obstruction or filling defects.   Cold cup bladder biopsies forceps were utilized to biopsy the vascular area  on the trigone and also a biopsy was taken of the bladder neck. The  biopsy  site on the trigone was fulgurated with the Bugbee electrode and the bladder  neck at 6 o'clock was fulgurated in the hope that this would prevent future  bleeding if this is the source of the hematuria. The bladder was emptied,  scope was removed, there was no significant blood loss whatsoever and a B&O  suppository was inserted per rectum. He was taken to the recovery room in  good condition having tolerated the procedure well.     Maretta Bees. Vonita Moss, M.D.  Electronically Signed    LJP/MEDQ  D:  03/10/2005  T:  03/10/2005  Job:  [id number]   cc:   [facility]

## 2010-10-09 NOTE — Op Note (Signed)
NAMEABHIRAJ, DOZAL                           ACCOUNT NO.:  [id number]   MEDICAL RECORD NO.:  [id number]                   PATIENT TYPE:  AMB   LOCATION:  [facility]                                 FACILITY:  [facility]   PHYSICIAN:  Maretta Bees. Vonita Moss, M.D.             DATE OF BIRTH:  11/22/1940   DATE OF PROCEDURE:  08/19/2003  DATE OF DISCHARGE:                                 OPERATIVE REPORT   PREOPERATIVE DIAGNOSIS:  Hematuria with positive NMP-22.   POSTOPERATIVE DIAGNOSIS:  Hematuria with positive NMP-22.   OPERATION/PROCEDURE:  1. Cystoscopy.  2. Bilateral retrograde pyelograms with interpretation.  3. Cold cup bladder biopsy.   SURGEON:  Maretta Bees. Vonita Moss, M.D.   ANESTHESIA:  General.   INDICATIONS:  This 70 year old white male had an episode of gross hematuria  with no flank pain, no stone passage.  A CT scan, a triad imaging showed a  suspected cyst in the upper pole of the left kidney.  Followup renal  ultrasound showed a 10 x 12 x 10 mm cyst in the upper pole of the left  kidney.  Cystoscopy in the office revealed no stones or inflammatory lesions  or papillary lesions.  However, he has had two positive NMP-22 tests done on  his urine which are suggestive of TCC of the urinary tract.  He is brought  to the OR today for further evaluation.  He had to stop his Coumadin ahead  of time that he takes for atrial fibrillation.   DESCRIPTION OF PROCEDURE:  The patient is brought to the operating room and  placed in the lithotomy position.  The external genitalia were prepped and  draped in the usual fashion.  He was cystoscoped.  The anterior urethra was  normal.  The prostate had partial obstruction.  The bladder had just some  mild diffuse increased vascularity but no localized lesions and no stones or  papillary tumors.  Bilateral retrograde pyelograms were obtained using acorn-  tip catheter and the upper tracts were unobstructed with no filling defects  and looked perfectly  normal bilaterally.  Random biopsies were taken across  the base and dome in three separate areas using the cold cup  bladder biopsy forceps.  Biopsy sites were fulgurated with the Bugbee  electrode.  There was essentially no blood loss and good hemostasis and the  bladder was emptied, the scope removed and the patient was sent to the  recovery room in good condition having tolerated the procedure well.                                               Maretta Bees. Vonita Moss, M.D.    LJP/MEDQ  D:  08/19/2003  T:  08/19/2003  Job:  [id number]   cc:   Marya Landry.  Theresia Lo, M.D.  [address]  [place]  [place] [address]  Fax: [phone]

## 2010-10-09 NOTE — Discharge Summary (Signed)
[name], [name]                           ACCOUNT NO.:  [id number]   MEDICAL RECORD NO.:  [id number]                   PATIENT TYPE:  INP   LOCATION:  3734                                 FACILITY:  [facility]   PHYSICIAN:  Thereasa Solo. Little, M.D.              DATE OF BIRTH:  Dec 25, 1940   DATE OF ADMISSION:  12/17/2003  DATE OF DISCHARGE:  12/19/2003                                 DISCHARGE SUMMARY   DISCHARGE DIAGNOSES:  1. Paroxysmal atrial fibrillation with new onset of atrial flutter.  2. Chronic anticoagulation therapy.  3. Coronary artery disease status post stenting of the left anterior     descending in 2002.  4. Adult-onset diabetes mellitus.  5. Hiatal hernia.  6. Hyperlipidemia.  7. Prostatitis.  8. Hypertension.   DISCHARGE MEDICATIONS:  1. Rythmol 150 mg t.i.d.  2. Norvasc 10 mg once daily.  3. Prilosec 20 mg once daily.  4. Glucotrol 5 mg b.i.d.  5. Tricor 160 mg once daily.  6. Actos 45 mg once daily.  7. Metformin 1000 mg b.i.d.  8. Micardis 80 mg once daily.  9. Flomax 0.4 mg b.i.d.  10.      Toprol XL 50 mg once daily.  11.      Keflex 500 mg q.i.d.  12.      Lisinopril 10 mg b.i.d.  13.      Lanoxin 0.25 mg once daily.  14.      Lipitor 30 mg once daily.  15.      Coumadin 10 mg at the day of discharge and 5 mg once daily x3 days     then patient is supposed to have blood work to check INR and prothrombin     time.   DISCHARGE INSTRUCTIONS:  1. Activity:  As tolerated.  2. Diet:  Low fat, low salt, low cholesterol diet.  3. Patient was instructed to discontinue atenolol.  4. Blood work on Monday, December 23, 2003 to recheck prothrombin time and INR.  5. Follow up with Dr. Clarene Duke at the office on January 07, 2004 at 3:45 p.m.   HOSPITAL PROCEDURES:  None.   HOSPITAL CONSULTATIONS:  None.   HISTORY OF PRESENT ILLNESS/HOSPITAL COURSE AND PERTINENT STATUS:  This is a  70 year old Caucasian gentleman with a prior history of coronary artery  disease and  history of PAF who has been on Rythmol and quite stable  developed hematuria and had cystoscopy for evaluation and has a history also  of recurrent UTI and prostatitis requiring self-catheterization through December 11, 2003.  Patient was started on Keflex and he has been off of Coumadin due  to this invasive procedure since cystoscopy.  A few days prior to the  presentation in the emergency room he complains of awakening few times at  night with sweats and had shortness of breath but no PND and no chest pain,  he also complained  of a rapid heart rate that was persistent and the day of  his emergency room presentation heart rate was as high as 130-140.  On  admission in the emergency room he was given IV Cardizem bolus and then drip  and started lowering with Coumadin.   He was evaluated next morning by Dr. Clarene Duke.  His INR was 1.1 and patient  was given higher dose of Coumadin 10 mg and on the day of discharge his INR  was 2.6.  Patient did not have any complaints of chest pain or shortness of  breath.  Telemetry showed atrial fibrillation with flutter. And Dr. Clarene Duke  had a discussion with patient about atrial flutter ablation.  Patient deemed  to be stable for discharge home and the dose of Coumadin today at the time  of discharge is 10 mg, tomorrow patient is to resume previous home dose 5 mg  on Monday, Wednesday, Friday and 2.5 mg on Saturday, Sunday, Tuesday and  Thursday.  He is to recheck his INR on Monday, December 23, 2003 and office  will instruct patient on how to continue taking it.  His CBC showed  hemoglobin 13.4, TSH was 1.97, cardiac enzymes were negative, chest x-ray  did not show any acute changes.  Blood pressure at the time of discharge was  130/72, heart rate 60s, and telemetry showed atrial fibrillation.      Raymon Mutton, P.A.                    Thereasa Solo. Little, M.D.    MK/MEDQ  D:  12/19/2003  T:  12/19/2003  Job:  [id number]

## 2010-10-09 NOTE — Op Note (Signed)
NAMELORRIS, CARDUCCI                 ACCOUNT NO.:  [id number]   MEDICAL RECORD NO.:  [id number]          PATIENT TYPE:  AMB   LOCATION:  [facility]                         FACILITY:  [facility]   PHYSICIAN:  Maretta Bees. Vonita Moss, M.D.DATE OF BIRTH:  Jun 15, 1940   DATE OF PROCEDURE:  06/07/2005  DATE OF DISCHARGE:                                 OPERATIVE REPORT   PREOPERATIVE DIAGNOSIS:  Recurrent hematuria.   POSTOPERATIVE DIAGNOSIS:  Recurrent hematuria.   PROCEDURE:  Cystoscopy, cold cup bladder biopsies, and fulguration of  bladder.   SURGEON:  Dr. Larey Dresser   ANESTHESIA:  General.   INDICATIONS:  This 70 year old gentleman has had recurrent hematuria usually  initial.  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  He  has also had some reactive atypia.  He has been on Coumadin in the past but  is off that now.  He has also been on Avodart for suspected prostatic  bleeding.  However, he is continuing to have grossly bloody urine especially  and specifically initial in origin.  Brought the OR today for further  workup.   PROCEDURE:  The patient is brought to the operating room, placed in  lithotomy position, and external genitalia prepped and draped in the usual  fashion.  He was cystoscoped.  The anterior urethra was totally normal.  Prostate prostate was somewhat long and had bilobar hypertrophy.  There was  no median lobe.  There were no mucosal lesions in the prostatic urethra.  The bladder had a small old blood clot adherent to an area of mucosa then  when the blood clot was dislodged, it appeared as if that might have been a  previous bleeding point.  He had a small diverticulum that just had some  increased vessels inside of it.  There were no papillary tumors,  ulcerations, stones, or any obvious lesion other than that  blood clot.  However, after filling up the bladder and then looking back in, there seemed  to be an increased number of submucosal petechiae and hemorrhage, almost as  if he had interstitial cystitis.  Three of these petechial areas were  biopsied with the cold cup bladder biopsy forceps.  Biopsy sites were  fulgurated with the Bugbee electrode.  At the conclusion of the procedure,  he essentially had no blood loss as a result of the procedure and good  hemostasis, as the scope was removed after emptying the bladder.  The  patient sent to recovery room in good condition having tolerated the  procedure well.      Maretta Bees. Vonita Moss, M.D.  Electronically Signed     LJP/MEDQ  D:  06/07/2005  T:  06/07/2005  Job:  [id number]

## 2010-10-09 NOTE — Discharge Summary (Signed)
[employer]  Patient:    [name], [name] Visit Number: [id number] MRN: [id number]          Service Type: MED Location: [facility] 2902 01 Attending Physician:  Loreli Dollar Dictated by:   Adrian Saran, N.P. Admit Date:  06/02/2001 Discharge Date: 06/05/2001   CC:         Caryn Bee L. Little, M.D.   Discharge Summary  DISCHARGE DIAGNOSES: 1. Paroxysmal atrial fibrillation. 2. Coronary artery disease status post percutaneous transluminal coronary    angioplasty and stent to the left anterior descending February 2002. 3. Hypertension, controlled. 4. Diabetes, controlled.  PROCEDURES:  None.  COMPLICATIONS:  None.  DISCHARGE STATUS:  Stable.  Improved.  ADMISSION HISTORY:  This is a 70 year old male with known history of CAD and PAF.  He was on chronic Coumadin therapy and was also being treated with Rythmol.  He has been doing well with no recurrent problems with his atrial fibrillation until a couple of days prior to this admission when he had gone to [place] and [employer].  He states he had done a lot of walking and physical exertion with his grandchildren and had been having some occasional episodes of palpitations.  However, during his last night in [place], he had onset of rapid heartbeat and palpitations.  He states this lasted basically the entire evening and he refused to go to the emergency room in [place] for treatment.  He preferred to come home.  His daughter drove him back to [place] and he presented to the emergency room complaining of palpitations and some mild lightheadedness and dizziness.  He denied any chest pain or other symptoms.  Telemetry revealed underlying atrial fibrillation, atrial flutter with a rapid ventricular response initially at 135.  Initially, admission laboratory results were normal and he had a therapeutic INR at 3.0.  Cardiac enzymes were negative with a total CK of 42 and 1.1 MB.  Troponin was  0.03.  EKG showed underlying atrial fibrillation/flutter and some nonspecific ST and T-wave changes.  However, no evidence of ischemia.  He was alert and oriented x3.  Heart had normal heart sounds heard.  No gallop or murmur.  Lungs were clear bilaterally.  Abdomen was benign.  Extremities were nonedematous.  The patient was seen in the ER by Delrae Rend, M.D., and admitted for rhythm stabilization.  He continue his Rythmol at this point and added low dose beta-blocker.  Digoxin was also continued.  Cardioversion was tentatively planned if the patient did not spontaneously convert.  Apparently the patient had a brief episode of hypotension, hence Rythmol was discontinued and IV amiodarone was initiated for rate control.  Dopamine was also ordered.  He apparently had severe tachycardia immediately after initiation of the dopamine, hence this was discontinued basically as soon as it was started. Vital signs were stable at this point.  Blood pressure had normalized.  HOSPITAL COURSE:  Upon being seen on the morning of June 03, 2001, he had spontaneously converted to normal sinus rhythm with frequent PACs.  The PAF had resolved.  He had no complaints other than noticing occasional palpitations.  He states he had no further tachycardia.  IV amiodarone was discontinued at this point and he was changed over to p.o.  Digoxin was continued.  INR was slightly high on this day and his Coumadin was held. Vital signs remained stable.  Labs were normal.  On June 04, 2001, he continued to remain stable.  He continued to  be in normal sinus rhythm with frequent PABs.  INR was now therapeutic at 2.5 and p.o. Coumadin was continued.  On June 05, 2001, he was discharged home on p.o. amiodarone.  Vital signs were stable.  The rest of his hospitalization was uneventful.  We will set him up for outpatient pulmonary function tests with effusion capacity since changing to amiodarone to  establish a baseline.  He will also need repeat lab work in approximately one week for digoxin level and PT/INR.  DISCHARGE MEDICATIONS:  1. Lisinopril 10 mg q.d.  2. Glucotrol 5 mg b.i.d.  3. Actos 45 mg q.d.  4. Glucophage 500 mg b.i.d.  5. Amiodarone 200 mg q.d.  6. Lanoxin 0.25 mg q.d.  7. Prilosec 20 mg q.d.  8. Atenolol 50 mg one half tablet p.r.n.  9. Coumadin 5 mg q.d. except 2.5 mg on Wednesday and Sunday. 10. He is to stop taking Rythmol.  ACTIVITY:  As tolerated.  DIET:  He is to maintain a low salt, low fat, low cholesterol diet as well as continue his diabetic diet restrictions.  He is to maintain a very low caffeine intake.  FOLLOW-UP:  He will need repeat blood work on Monday, June 12, 2001, to check a Lanoxin level as well as a PT and INR.  We will set him up for pulmonary function tests as an outpatient.  He is to see Julieanne Manson, M.D., in approximately three weeks after discharge.  He is to call for an appointment.   Dictated by:   Adrian Saran, N.P. Attending Physician:  Loreli Dollar DD:  06/19/01 TD:  06/19/01 Job: [id number]

## 2010-10-09 NOTE — Discharge Summary (Signed)
[place]. [facility]  Patient:    [name], [name]                        MRN: [id number] Adm. Date:  [id number] Disc. Date: [id number] Attending:  Berry, Jonathan [place] Dictator:   Marya Fossa, P.A. CC:         Anna Genre. Little, M.D.  Thereasa Solo. Little, M.D.   Discharge Summary  ADMISSION DIAGNOSES: 1. New-onset atrial fibrillation with rapid ventricular response. 2. Known coronary artery disease. 3. Hyperlipidemia. 4. Non-insulin-dependent diabetes mellitus. 5. Hypertension. 6. Gastroesophageal reflux disease. 7. History of supraventricular tachycardia. 8. Depression.  DISCHARGE DIAGNOSES: 1. New-onset atrial fibrillation with rapid ventricular response,    spontaneously converted to normal sinus rhythm, loading with Coumadin. 2. Known coronary artery disease. 3. Hyperlipidemia. 4. Non-insulin-dependent diabetes mellitus. 5. Hypertension. 6. Gastroesophageal reflux disease. 7. History of supraventricular tachycardia. 8. Depression.  HISTORY OF PRESENT ILLNESS:  [name] is a very pleasant 70 year old, married, white male patient of Dr. Julieanne Manson and Dr. Catha Gosselin with a history of SVT, hyperlipidemia and recently diagnosed with coronary artery disease about a month ago.  He had an LAD intervention.  He had normal LV function at that time.  He also has non-insulin-dependent diabetes mellitus, GERD and hypertension.  The patient states that on July 30, 2000, around 10 a.m. after taking his medication, he noticed his heart started to race and skip.  This persisted throughout the day and into this morning.  He has a history of SVT with similar symptoms, but the heart beats are usually "harder" and self-limited. Today and yesterdays episodes have been prolonged which is unusual for him. He has not used any decongestants or alcohol.  He denies chest pain, syncope, shortness of breath or presyncope.  No neurologic deficits have been  noted.  EKG in the emergency room shows atrial fibrillation with ventricular response with heart rate 118.  Labs are pending.  PROCEDURES:  None.  CONSULTATIONS:  None.  ASSESSMENT/PLAN:  The patient will be admitted for new-onset atrial fibrillation.  We will start IV heparin and consider Coumadin therapy.  We will plan TEE cardioversion for the morning.  We will start him on IV Cardizem for rate control and check a digoxin level.  HOSPITAL COURSE:  [name] was admitted to [facility] with atrial fibrillation and rapid ventricular response.  We started him on IV heparin and IV Cardizem.  However, the nursing staff called from the emergency room approximately one hour after the patient was seen, and stated that his heart rate was in the 50s and therefore Cardizem was never started.  Cardiac enzymes were negative x 3.  TSH was normal at 2.987.  Digoxin was low at 0.7.  CBC and BMP within normal limits.  Chest x-ray showed no active disease.  At some point between the time of admission and this morning, the patient spontaneously converted to normal sinus rhythm.  TEE cardioversion was canceled.  We started the patient on Coumadin and he will be discharged home today in stable condition and in sinus rhythm.  DISCHARGE MEDICATIONS:  1. Coumadin 5 mg a day.  2. Enteric coated aspirin 325 mg q.d.  3. Imdur 30 mg q.d.  4. Atenolol 50 mg twice a day.  5. Lipitor 20 mg 1-1/2 pills a day.  6. Prozac 20 mg a day.  7. Lanoxin 0.25 mg two a day.  8. Glucotrol XL 5 mg as  before.  9. Actos 45 mg q.d. 10. Zestril 5 mg q.d. 11. Prilosec 20 mg q.d. 12. Nitroglycerin as needed for chest pain.  ACTIVITY:  As tolerated.  DIET:  Low fat, low salt, low cholesterol diabetic diet.  FOLLOWUP:  The patient is to get an INR check on Thursday, August 04, 2000. Once his INR is therapeutic, he will need to stop his aspirin.  Follow-up appointment scheduled with Dr. Julieanne Manson for August 08, 2000, at 12:50 p.m.  SPECIAL INSTRUCTIONS:  If there are any problems or questions in the interim, the patient should call. DD:  08/01/00 TD:  08/02/00 Job: [id number]

## 2011-02-11 LAB — POCT CARDIAC MARKERS
CKMB, poc: <1 — ABNORMAL LOW
Myoglobin, poc: 59.2

## 2011-02-11 LAB — CBC
HCT: 39.8
HCT: 44.4
Hemoglobin: 13.6
Hemoglobin: 15.1
Hemoglobin: 15.2
MCHC: 34
MCHC: 34.1
MCV: 88.8
RBC: 4.52
RBC: 5.02
RBC: 5.1
RDW: 12.9
RDW: 13.1
RDW: 13.2
WBC: 7.1
WBC: 8.6

## 2011-02-11 LAB — COMPREHENSIVE METABOLIC PANEL
ALT: 28
Albumin: 4.2
Alkaline Phosphatase: 51
Calcium: 9.7
GFR calc Af Amer: >60
Potassium: 4.2
Sodium: 135
Total Bilirubin: 1.1
Total Protein: 7.1

## 2011-02-11 LAB — PROTIME-INR: Prothrombin Time: 13.6

## 2011-02-11 LAB — CARDIAC PANEL(CRET KIN+CKTOT+MB+TROPI)
Relative Index: INVALID
Total CK: 43

## 2011-02-11 LAB — DIFFERENTIAL
Basophils Absolute: 0
Eosinophils Absolute: 0.1
Monocytes Absolute: 0.5

## 2011-02-11 LAB — URINALYSIS, ROUTINE W REFLEX MICROSCOPIC
Bilirubin Urine: NEGATIVE
Glucose, UA: NEGATIVE
Ketones, ur: NEGATIVE
Nitrite: NEGATIVE
Protein, ur: NEGATIVE

## 2011-02-11 LAB — APTT: aPTT: 32

## 2011-02-11 LAB — TSH: TSH: 2.043

## 2011-02-11 LAB — CK TOTAL AND CKMB (NOT AT ARMC)
Relative Index: INVALID
Total CK: 45

## 2011-02-11 LAB — HEPARIN LEVEL (UNFRACTIONATED): Heparin Unfractionated: 0.63

## 2011-04-08 ENCOUNTER — Other Ambulatory Visit: Payer: Self-pay | Admitting: Gastroenterology

## 2011-12-01 DIAGNOSIS — R9439 Abnormal result of other cardiovascular function study: Secondary | ICD-10-CM | POA: Diagnosis present

## 2011-12-06 ENCOUNTER — Other Ambulatory Visit: Payer: Self-pay | Admitting: Cardiology

## 2011-12-07 ENCOUNTER — Ambulatory Visit
Admission: RE | Admit: 2011-12-07 | Discharge: 2011-12-07 | Disposition: A | Discharge status: Home / Self Care | Payer: Medicare Other | Source: Ambulatory Visit | Attending: Cardiology | Admitting: Cardiology

## 2011-12-07 ENCOUNTER — Encounter (HOSPITAL_COMMUNITY): Payer: Self-pay | Admitting: Pharmacy Technician

## 2011-12-07 ENCOUNTER — Other Ambulatory Visit: Payer: Self-pay | Admitting: Cardiology

## 2011-12-07 DIAGNOSIS — Z01811 Encounter for preprocedural respiratory examination: Secondary | ICD-10-CM

## 2011-12-08 ENCOUNTER — Encounter (HOSPITAL_COMMUNITY)
Admission: RE | Disposition: A | Discharge status: Home / Self Care | Payer: Self-pay | Source: Ambulatory Visit | Attending: Cardiology

## 2011-12-08 ENCOUNTER — Encounter (HOSPITAL_COMMUNITY): Payer: Self-pay | Admitting: Cardiology

## 2011-12-08 ENCOUNTER — Ambulatory Visit (HOSPITAL_COMMUNITY)
Admission: RE | Admit: 2011-12-08 | Discharge: 2011-12-09 | Disposition: A | Discharge status: Home / Self Care | Payer: Medicare Other | Source: Ambulatory Visit | Attending: Cardiology | Admitting: Cardiology

## 2011-12-08 DIAGNOSIS — I2510 Atherosclerotic heart disease of native coronary artery without angina pectoris: Secondary | ICD-10-CM | POA: Diagnosis present

## 2011-12-08 DIAGNOSIS — Z9861 Coronary angioplasty status: Secondary | ICD-10-CM | POA: Insufficient documentation

## 2011-12-08 DIAGNOSIS — Z955 Presence of coronary angioplasty implant and graft: Secondary | ICD-10-CM

## 2011-12-08 DIAGNOSIS — Z87448 Personal history of other diseases of urinary system: Secondary | ICD-10-CM | POA: Insufficient documentation

## 2011-12-08 DIAGNOSIS — G4733 Obstructive sleep apnea (adult) (pediatric): Secondary | ICD-10-CM | POA: Diagnosis present

## 2011-12-08 DIAGNOSIS — Z95820 Peripheral vascular angioplasty status with implants and grafts: Secondary | ICD-10-CM

## 2011-12-08 DIAGNOSIS — E785 Hyperlipidemia, unspecified: Secondary | ICD-10-CM | POA: Diagnosis present

## 2011-12-08 DIAGNOSIS — R0602 Shortness of breath: Secondary | ICD-10-CM | POA: Diagnosis present

## 2011-12-08 DIAGNOSIS — R9439 Abnormal result of other cardiovascular function study: Secondary | ICD-10-CM | POA: Diagnosis present

## 2011-12-08 DIAGNOSIS — I480 Paroxysmal atrial fibrillation: Secondary | ICD-10-CM | POA: Insufficient documentation

## 2011-12-08 DIAGNOSIS — I10 Essential (primary) hypertension: Secondary | ICD-10-CM | POA: Diagnosis present

## 2011-12-08 DIAGNOSIS — E119 Type 2 diabetes mellitus without complications: Secondary | ICD-10-CM | POA: Diagnosis present

## 2011-12-08 HISTORY — DX: Dependence on other enabling machines and devices: Z99.89

## 2011-12-08 HISTORY — DX: Personal history of other diseases of the digestive system: Z87.19

## 2011-12-08 HISTORY — DX: Type 2 diabetes mellitus without complications: E11.9

## 2011-12-08 HISTORY — DX: Obstructive sleep apnea (adult) (pediatric): G47.33

## 2011-12-08 HISTORY — PX: PERCUTANEOUS CORONARY STENT INTERVENTION (PCI-S): SHX5485

## 2011-12-08 HISTORY — PX: LEFT HEART CATHETERIZATION WITH CORONARY ANGIOGRAM: SHX5451

## 2011-12-08 HISTORY — DX: Personal history of other diseases of the respiratory system: Z87.09

## 2011-12-08 HISTORY — PX: RENAL ANGIOGRAM: SHX5509

## 2011-12-08 HISTORY — DX: Benign prostatic hyperplasia without lower urinary tract symptoms: N40.0

## 2011-12-08 HISTORY — DX: Hyperlipidemia, unspecified: E78.5

## 2011-12-08 HISTORY — DX: Peripheral vascular angioplasty status with implants and grafts: Z95.820

## 2011-12-08 HISTORY — DX: Personal history of other diseases of urinary system: Z87.448

## 2011-12-08 HISTORY — DX: Essential (primary) hypertension: I10

## 2011-12-08 HISTORY — PX: CORONARY ANGIOPLASTY WITH STENT PLACEMENT: SHX49

## 2011-12-08 HISTORY — DX: Migraine, unspecified, not intractable, without status migrainosus: G43.909

## 2011-12-08 HISTORY — DX: Paroxysmal atrial fibrillation: I48.0

## 2011-12-08 HISTORY — DX: Atherosclerotic heart disease of native coronary artery without angina pectoris: I25.10

## 2011-12-08 HISTORY — DX: Gastro-esophageal reflux disease without esophagitis: K21.9

## 2011-12-08 HISTORY — DX: Presence of coronary angioplasty implant and graft: Z95.5

## 2011-12-08 LAB — GLUCOSE, CAPILLARY
Glucose-Capillary: 136 mg/dL — ABNORMAL HIGH (ref 70–99)
Glucose-Capillary: 223 mg/dL — ABNORMAL HIGH (ref 70–99)

## 2011-12-08 SURGERY — LEFT HEART CATHETERIZATION WITH CORONARY ANGIOGRAM
Anesthesia: LOCAL

## 2011-12-08 MED ORDER — SODIUM CHLORIDE 0.9 % IV SOLN: 1.0000 mL/kg/h | INTRAVENOUS | Status: AC

## 2011-12-08 MED ORDER — ACETAMINOPHEN 325 MG PO TABS: 650.0000 mg | ORAL_TABLET | ORAL | Status: DC | PRN

## 2011-12-08 MED ORDER — ONDANSETRON HCL 4 MG/2ML IJ SOLN: 4.0000 mg | Freq: Four times a day (QID) | INTRAMUSCULAR | Status: DC | PRN

## 2011-12-08 MED ORDER — PANTOPRAZOLE SODIUM 40 MG PO TBEC: 40.0000 mg | DELAYED_RELEASE_TABLET | Freq: Every day | ORAL | Status: DC

## 2011-12-08 MED ORDER — DIPHENHYDRAMINE HCL 25 MG PO CAPS
25.0000 mg | ORAL_CAPSULE | Freq: Every day | ORAL | Status: DC
  Administered 2011-12-08: 25 mg via ORAL
  Filled 2011-12-08 (×2): qty 1

## 2011-12-08 MED ORDER — ALISKIREN FUMARATE 150 MG PO TABS
150.0000 mg | ORAL_TABLET | Freq: Every day | ORAL | Status: DC
Start: 2011-12-09 — End: 2011-12-09
  Filled 2011-12-08: qty 1

## 2011-12-08 MED ORDER — FLUTICASONE PROPIONATE 50 MCG/ACT NA SUSP
1.0000 | Freq: Two times a day (BID) | NASAL | Status: DC
  Administered 2011-12-08: 1 via NASAL
  Filled 2011-12-08: qty 16

## 2011-12-08 MED ORDER — ACETAMINOPHEN 500 MG PO TABS
500.0000 mg | ORAL_TABLET | Freq: Every day | ORAL | Status: DC
  Administered 2011-12-08: 22:00:00 500 mg via ORAL
  Filled 2011-12-08 (×2): qty 1

## 2011-12-08 MED ORDER — GLIPIZIDE 10 MG PO TABS
10.0000 mg | ORAL_TABLET | Freq: Two times a day (BID) | ORAL | Status: DC
  Administered 2011-12-09: 10 mg via ORAL
  Filled 2011-12-08 (×3): qty 1

## 2011-12-08 MED ORDER — INSULIN ASPART 100 UNIT/ML ~~LOC~~ SOLN
0.0000 [IU] | Freq: Every day | SUBCUTANEOUS | Status: DC
  Administered 2011-12-08: 2 [IU] via SUBCUTANEOUS

## 2011-12-08 MED ORDER — VERAPAMIL HCL 2.5 MG/ML IV SOLN
INTRAVENOUS | Status: AC
  Filled 2011-12-08: qty 2

## 2011-12-08 MED ORDER — SODIUM CHLORIDE 0.9 % IJ SOLN: 3.0000 mL | Freq: Two times a day (BID) | INTRAMUSCULAR | Status: DC

## 2011-12-08 MED ORDER — FENTANYL CITRATE 0.05 MG/ML IJ SOLN
INTRAMUSCULAR | Status: AC
  Filled 2011-12-08: qty 2

## 2011-12-08 MED ORDER — NITROGLYCERIN 0.2 MG/ML ON CALL CATH LAB
INTRAVENOUS | Status: AC
  Filled 2011-12-08: qty 1

## 2011-12-08 MED ORDER — HEPARIN (PORCINE) IN NACL 2-0.9 UNIT/ML-% IJ SOLN
INTRAMUSCULAR | Status: AC
  Filled 2011-12-08: qty 2000

## 2011-12-08 MED ORDER — LIDOCAINE HCL (PF) 1 % IJ SOLN
INTRAMUSCULAR | Status: AC
  Filled 2011-12-08: qty 30

## 2011-12-08 MED ORDER — AMLODIPINE BESYLATE 5 MG PO TABS
5.0000 mg | ORAL_TABLET | Freq: Two times a day (BID) | ORAL | Status: DC
  Administered 2011-12-08: 20:00:00 5 mg via ORAL
  Filled 2011-12-08 (×3): qty 1

## 2011-12-08 MED ORDER — SODIUM CHLORIDE 0.9 % IV SOLN: 250.0000 mL | INTRAVENOUS | Status: DC

## 2011-12-08 MED ORDER — HYDRALAZINE HCL 50 MG PO TABS
50.0000 mg | ORAL_TABLET | Freq: Two times a day (BID) | ORAL | Status: DC
  Administered 2011-12-08: 50 mg via ORAL
  Filled 2011-12-08 (×3): qty 1

## 2011-12-08 MED ORDER — MORPHINE SULFATE 2 MG/ML IJ SOLN
2.0000 mg | INTRAMUSCULAR | Status: DC | PRN
  Administered 2011-12-08: 2 mg via INTRAVENOUS
  Filled 2011-12-08: qty 1

## 2011-12-08 MED ORDER — MIDAZOLAM HCL 2 MG/2ML IJ SOLN
INTRAMUSCULAR | Status: AC
  Filled 2011-12-08: qty 2

## 2011-12-08 MED ORDER — BIVALIRUDIN 250 MG IV SOLR
INTRAVENOUS | Status: AC
  Filled 2011-12-08: qty 250

## 2011-12-08 MED ORDER — IRBESARTAN 300 MG PO TABS
300.0000 mg | ORAL_TABLET | Freq: Every day | ORAL | Status: DC
  Filled 2011-12-08: qty 1

## 2011-12-08 MED ORDER — INSULIN ASPART 100 UNIT/ML ~~LOC~~ SOLN
0.0000 [IU] | Freq: Three times a day (TID) | SUBCUTANEOUS | Status: DC
  Administered 2011-12-09: 3 [IU] via SUBCUTANEOUS

## 2011-12-08 MED ORDER — ATORVASTATIN CALCIUM 40 MG PO TABS
40.0000 mg | ORAL_TABLET | Freq: Every day | ORAL | Status: DC
  Administered 2011-12-08: 22:00:00 40 mg via ORAL
  Filled 2011-12-08 (×2): qty 1

## 2011-12-08 MED ORDER — ASPIRIN 81 MG PO CHEW
81.0000 mg | CHEWABLE_TABLET | Freq: Every day | ORAL | Status: DC
  Filled 2011-12-08: qty 1

## 2011-12-08 MED ORDER — ATENOLOL 50 MG PO TABS
50.0000 mg | ORAL_TABLET | Freq: Two times a day (BID) | ORAL | Status: DC
  Administered 2011-12-08: 22:00:00 50 mg via ORAL
  Filled 2011-12-08 (×3): qty 1

## 2011-12-08 MED ORDER — DIPHENHYDRAMINE-APAP (SLEEP) 25-500 MG PO TABS: 1.0000 | ORAL_TABLET | Freq: Every day | ORAL | Status: DC

## 2011-12-08 MED ORDER — SODIUM CHLORIDE 0.9 % IJ SOLN: 3.0000 mL | INTRAMUSCULAR | Status: DC | PRN

## 2011-12-08 MED ORDER — LISINOPRIL 20 MG PO TABS
20.0000 mg | ORAL_TABLET | Freq: Two times a day (BID) | ORAL | Status: DC
  Administered 2011-12-08: 20 mg via ORAL
  Filled 2011-12-08 (×3): qty 1

## 2011-12-08 MED ORDER — LINAGLIPTIN 5 MG PO TABS
5.0000 mg | ORAL_TABLET | Freq: Every day | ORAL | Status: DC
  Filled 2011-12-08: qty 1

## 2011-12-08 MED ORDER — SODIUM CHLORIDE 0.9 % IV SOLN
0.2500 mg/kg/h | INTRAVENOUS | Status: DC
  Filled 2011-12-08: qty 250

## 2011-12-08 MED ORDER — TICAGRELOR 90 MG PO TABS
90.0000 mg | ORAL_TABLET | Freq: Two times a day (BID) | ORAL | Status: DC
  Filled 2011-12-08 (×2): qty 1

## 2011-12-08 MED ORDER — TICAGRELOR 90 MG PO TABS
ORAL_TABLET | ORAL | Status: AC
  Filled 2011-12-08: qty 2

## 2011-12-08 MED ORDER — DIAZEPAM 5 MG PO TABS
5.0000 mg | ORAL_TABLET | ORAL | Status: AC
  Administered 2011-12-08: 5 mg via ORAL
  Filled 2011-12-08: qty 1

## 2011-12-08 MED ORDER — HEPARIN SODIUM (PORCINE) 1000 UNIT/ML IJ SOLN
INTRAMUSCULAR | Status: AC
  Filled 2011-12-08: qty 1

## 2011-12-08 MED ORDER — NON FORMULARY: 20.0000 mg | Freq: Every day | Status: DC

## 2011-12-08 MED ORDER — SODIUM CHLORIDE 0.9 % IV SOLN
INTRAVENOUS | Status: DC
  Administered 2011-12-08: 08:00:00 via INTRAVENOUS

## 2011-12-08 NOTE — Brief Op Note (Signed)
12/08/2011  12:48 PM  PATIENT:  [name]  71 y.o. male with a h/o PCI to LAD in 2002, who was referred for cardiac catheterization following a High Risk TM Nuclear Stress Test (Myoview) with LAD ischemia.  He has noted some exertional SOB.  PRE-OPERATIVE DIAGNOSIS:  Exertional SOB, High Risk Abnormal Nuclear Stress Test, Known CAD with PCI of LAD.  POST-OPERATIVE DIAGNOSIS:    Moderate LCx and RCA Disease  100%, Chronically occluded late Prox to Mid LAD at the distal end of the previously placed BMS stent.  Long complex lesion before a "normal segment" well before D4.  ~95% Distal LAD lesion just after D4.  Both D1 and D2 have ostial/proximal ~80% lesions, but TIMI 3 flow noted.  Well preserved EF of ~55-60%, LVEDP ~18 mmHg  No evidence of Renal artery stenosis.   PROCEDURE:  Procedure(s) (LRB):  LEFT HEART CATHETERIZATION WITH CORONARY ANGIOGRAM (N/A)  RENAL ANGIOGRAM (N/A)  COMPLEX, DIFFICULT PERCUTANEOUS CORONARY STENT INTERVENTION (PCI-S) of a Chronically occluded Prox-Mid LAD with 2 Overlapping DES Xience Expedition 2.5 mm x 38 mm distal (final diameter ~2.6 mm distal, 3.1 mm mid) and 3.0 mm x 16 mm proximal into previous stent (final diameter ~ 3.34 mm)  Balloon Angioplasty of the Distal LAD 95% lesion --> reducing the stenosis to ~10-20%  Widely patent bilateral Renal Arteries.  IC Nitroglycerin injection 200 mcg x 6  SURGEON:  Surgeon(s) and Role:    * Marykay Lex, MD - Primary  ANESTHESIA:   local and IV sedation - IV Versed 5mg , Fentanyl 100 mcg, PO Valium 5mg   EBL:    < 10 ml Foley catheter placed: UOP ~797ml  EQUIPEMENT: 5 Fr R Radial Glide Sheath - upsized to 6Fr for PCI;   TIG 4.0: LCA and RCA angiography  Straight Pigtail: LV Hemodynamics, LV Gram, Abdominal Aortogram  PCI: Guide - XBLAD 3.5; Wires: PT2 MS 300 (after unable to advance Cougar); Sport   LOCAL MEDICATIONS USED:  LIDOCAINE 2 ml  SPECIMEN:  No Specimen  TR BAND:  13 ml Air; 1220 hrs  - non-occlusive hemostasis.  DICTATION: .Note written in EPIC  PLAN OF CARE: Admit for overnight observation  PATIENT DISPOSITION:  PACU - hemodynamically stable.   Delay start of Pharmacological VTE agent (>24hrs) due to surgical blood loss or risk of bleeding: not applicable  HARDING,DAVID W, M.D., M.S. [facility] [address] [place], [place]  [address]  [phone] Pager # [phone]  12/08/2011 1:00 PM

## 2011-12-08 NOTE — H&P (Addendum)
History and Physical Interval Note:  NAME:  [name]   MRN: [id number] DOB:  08-28-1940   ADMIT DATE: 12/08/2011   12/08/2011 8:49 AM  [name] is a 71 y.o. male with a history of PMH below -- notably BMS PCI to the proximal LAD in 06/2010 by DR. Little.  As part of routine follow-up, he saw Dr. Clarene Duke in clinic.  He did note having some exertional shortness of breath, so a TM Myoview Nuclear ST was ordered to evaluate for potential coronary ischemia as the cause of his shortness of breath.  He has not noted angina, but did not have angina prior the the previous PCI in 2002.  The test was performed on 12/01/2011 -- he walked ~8 minutes & did develop shortness of breath along with some mild chest discomfort as a reason for stopping.  There were down-sloping ST-T segment depressions at peak stress that were positive for ischemia that were confirmed by the imaging study demonstrating essentially LAD distribution ischemia.  He was referred for invasive coronary evaluation by cardiac catheterization.   Also, due to his very difficult to control hypertension - on multiple medications, he is also referred for Renal Artery angiography to assess for potential Renal Artery Stenosis as a potential etiology.   Past Medical History  Diagnosis Date  . CAD (coronary artery disease), native coronary artery 06/2000    PCI to Proximal LAD 3.0 mm x 15 mm BMS  . Presence of bare metal stent in LAD coronary artery 06/2000    3.0 mm x 15 mm BMS  . Dyslipidemia (high LDL; low HDL)   . Hypertension, uncontrolled 06/2000  . OSA on CPAP   . Diabetes mellitus type 2, noninsulin dependent     complicated by CAD  . PAF (paroxysmal atrial fibrillation)     s/p RF ablation; not on warfartin  . History of hematuria     no longer on warfarin   No past surgical history on file.  FAMHx: No family history on file.  SOCHx:  does not have a smoking history on file. He does not have any smokeless tobacco history on  file. His alcohol and drug histories not on file.  ALLERGIES: Allergies  Allergen Reactions  . Dopamine Palpitations    HOME MEDICATIONS: Prescriptions prior to admission  Medication Sig Dispense Refill  . aliskiren (TEKTURNA) 150 MG tablet Take 150 mg by mouth daily.      Marland Kitchen amLODipine (NORVASC) 5 MG tablet Take 5 mg by mouth 2 (two) times daily.      Marland Kitchen aspirin EC 81 MG tablet Take 81 mg by mouth daily.      Marland Kitchen atenolol (TENORMIN) 50 MG tablet Take 50 mg by mouth 2 (two) times daily.      . beta carotene w/minerals (OCUVITE) tablet Take 1 tablet by mouth daily.      . Coenzyme Q10 (CO Q-10) 200 MG CAPS Take 1 tablet by mouth daily.      . diphenhydramine-acetaminophen (TYLENOL PM) 25-500 MG TABS Take 1 tablet by mouth at bedtime.      . fluticasone (FLONASE) 50 MCG/ACT nasal spray Place 1 spray into both nostrils 2 (two) times daily.      Marland Kitchen glipiZIDE (GLUCOTROL) 10 MG tablet Take 10 mg by mouth 2 (two) times daily before a meal.      . hydrALAZINE (APRESOLINE) 50 MG tablet Take 50 mg by mouth 2 (two) times daily.      . LevOCARNitine  L-Tartrate (L-CARNITINE) 500 MG CAPS Take 1 capsule by mouth daily.      Marland Kitchen lisinopril (PRINIVIL,ZESTRIL) 20 MG tablet Take 20 mg by mouth 2 (two) times daily.      . metFORMIN (GLUCOPHAGE) 500 MG tablet Take 1,000 mg by mouth 2 (two) times daily with a meal.      . Milk Thistle 250 MG CAPS Take 1 capsule by mouth daily.      . Misc Natural Products (MENS PROSTATE HEALTH FORMULA PO) Take 1 tablet by mouth 2 (two) times daily.      Marland Kitchen omeprazole (PRILOSEC) 20 MG capsule Take 20 mg by mouth daily.      . Potassium 99 MG TABS Take 1 tablet by mouth daily.      Marland Kitchen RASPBERRY KETONES PO Take 1 tablet by mouth daily.      . rosuvastatin (CRESTOR) 20 MG tablet Take 20 mg by mouth daily.      . sitaGLIPtin (JANUVIA) 100 MG tablet Take 100 mg by mouth daily.      Marland Kitchen telmisartan (MICARDIS) 80 MG tablet Take 80 mg by mouth 2 (two) times daily.        PHYSICAL EXAM:Blood  pressure 146/75, pulse 59, temperature 98.5 F (36.9 C), temperature source Oral, resp. rate 18, height 6' (1.829 m), weight 106.595 kg (235 lb), SpO2 98.00%. General appearance: alert, cooperative, appears stated age, no distress and pleasant mood & affect Neck: no adenopathy, no carotid bruit, no JVD, supple, symmetrical, trachea midline and thyroid not enlarged, symmetric, no tenderness/mass/nodules Lungs: clear to auscultation bilaterally, normal percussion bilaterally and non-labored Heart: regular rate and rhythm, S1, S2 normal, no murmur, click, rub or gallop Abdomen: soft, non-tender; bowel sounds normal; no masses,  no organomegaly Extremities: extremities normal, atraumatic, no cyanosis or edema Pulses: 2+ and symmetric Neurologic: Grossly normal; CN II-XII grossly intact  IMPRESSION & PLAN The patients' history has been reviewed, patient examined, no change in status from most recent note, stable for surgery. I have reviewed the patients' chart and labs. Questions were answered to the patient's satisfaction.  Principal Problem:  *Abnormal nuclear stress test - High risk; medium sized, moderate to severe defect in mid Anteroseptal, apical septal and anterior walls Active Problems:  Shortness of breath on exertion  CAD (coronary artery disease), native coronary artery  Presence of bare metal stent in LAD coronary artery -- 3.0 mm x 15 mm  Hypertension, uncontrolled  Dyslipidemia (high LDL; low HDL)  OSA on CPAP  Diabetes mellitus type 2, noninsulin dependent   [name] has presented today for surgery, with the diagnosis of shortness of breath on exertion with abnormal Nuclear Stress Test in the setting of known CAD-PCI as well as difficult to control HTN. The various methods of treatment have been discussed with the patient and family.   Risks / Complications include, but not limited to: Death, MI, CVA/TIA, VF/VT (with defibrillation), Bradycardia (need for temporary pacer  placement), contrast induced nephropathy, bleeding / bruising / hematoma / pseudoaneurysm, vascular or coronary injury (with possible emergent CT or Vascular Surgery), adverse medication reactions, infection.     After consideration of risks, benefits and other options for treatment, the patient has consented to Procedure(s):  LEFT HEART CATHETERIZATION AND CORONARY ANGIOGRAPHY +/- AD HOC PERCUTANEOUS CORONARY INTERVENTION Abdominal Aortic Angiogram with possible selective Renal Artery Angiography  as a surgical intervention.   We will proceed with the planned procedure.   Marykay Lex, MD, MS [facility]  [address] [place], [place]  [address]  [phone]  12/08/2011 8:49 AM

## 2011-12-08 NOTE — CV Procedure (Addendum)
[facility] PERCUTANEOUS CORONARY INTERVENTION REPORT  NAME:  JACQUES FIFE   MRN: [id number] DOB:  03-06-1941   ADMIT DATE: 12/08/2011  INTERVENTIONAL CARDIOLOGIST: Marykay Lex, M.D., MS PRIMARY CARE PROVIDER: No primary provider on file. PRIMARY CARDIOLOGIST: Julieanne Manson, MD.  PATIENT:  [name] is a 71 y.o. male  with a history of PMH of CAD s/p BMS PCI to the proximal LAD in 06/2010 by Dr. Clarene Duke along with very difficult to control HTN, dyslipidemia, adn DM-2. As part of routine follow-up, he saw Dr. Clarene Duke in clinic. He did note having some exertional shortness of breath, so a TM Myoview Nuclear ST was ordered to evaluate for potential coronary ischemia as the cause of his shortness of breath. He has not noted angina, but did not have angina prior the the previous PCI in 2002. The test was performed on 12/01/2011 -- he walked ~8 minutes & did develop shortness of breath along with some mild chest discomfort as a reason for stopping. There were down-sloping ST-T segment depressions at peak stress that were positive for ischemia that were confirmed by the imaging study demonstrating essentially LAD distribution ischemia. He was referred for invasive coronary evaluation by cardiac catheterization.  Also, due to his very difficult to control hypertension - on multiple medications, he is also referred for Renal Artery angiography to assess for potential Renal Artery Stenosis as a potential etiology.  PRE-OPERATIVE DIAGNOSIS:    Shortness of breath on exertion  Abnormal Nuclear Stress Test -- Anterior ischemia, High Risk  PROCEDURES PERFORMED:    LEFT HEART CATHETERIZATION WITH CORONARY ANGIOGRAM (N/A)   RENAL ANGIOGRAM (N/A)   COMPLEX, DIFFICULT PERCUTANEOUS CORONARY STENT INTERVENTION (PCI-S) of a Chronically Occluded Prox-Mid LAD with 2 Overlapping DES Xience Expedition 2.5 mm x 38 mm distal (final diameter ~2.6 mm distal, 3.1 mm mid) and 3.0 mm x 16 mm  proximal into previous stent (final diameter ~ 3.34 mm)   Balloon Angioplasty of the Distal LAD 95% lesion --> reducing the stenosis to ~10-20%   Widely patent bilateral Renal Arteries.   IC Nitroglycerin injection 200 mcg x 6  PROCEDURE: Consent: The procedure with Risks/Benefits/Alternatives and Indications was reviewed with the patient and family.  All questions were answered.    Risks / Complications include, but not limited to: Death, MI, CVA/TIA, VF/VT (with defibrillation), Bradycardia (need for temporary pacer placement), contrast induced nephropathy, bleeding / bruising / hematoma / pseudoaneurysm, vascular or coronary injury (with possible emergent CT or Vascular Surgery), adverse medication reactions, infection.    The patient voiced understanding and agree to proceed.   Consent for signed by MD and patient with RN witness -- placed on chart.  PROCEDURE: The patient was brought to the 2nd Floor [facility] in the fasting state and prepped and draped in the usual sterile fashion for Right radial or groin access.    A modified Allen's test with plethysmography was performed on the right wrist demonstrating adequate Ulnar Artery collateral flow.  Sterile technique was used including antiseptics, cap, gloves, gown, hand hygiene, mask and sheet.  Skin prep: Chlorhexidine  Time Out: Verified patient identification, verified procedure, site/side was marked, verified correct patient position, special equipment/implants available, medications/allergies/relevent history reviewed, required imaging and test results available.  Performed  Access: Right Radial Artery;  5 Fr Glide Sheath; Seldinger technique  Diagnostic:  5 Fr TIG 4.0 catheter advanced over a Versicore wire -- Left  Then Right Coronary Artery Angiography:  5 Fr Straight Pigtail Catheter exchanged over long exchange safety J wire-- LV Hemodynamics, LV Gram as well as Abdominal Aortic Angiogram (catheter  was pulled back around the arch and advanced into the descending)  Hemodynamics:  Central Aortic / Mean Pressures:  116/62 mmHg;  85 mmHg --> post procedure: 146/75 mmHg; 105 mmHg  Left Ventricular Pressures / EDP:  120/10 mmHg;  13 mmHg  Left Ventriculography:  EF:  60-65% %  Wall Motion:  Normal; no LAD distribution abnormality  Coronary Anatomy: Right Dominant  Left Main:  Large caliber vessel bifurcates into LAD, and Left Circumflex LAD:  Large caliber vessel with a proximal stent is widely patent, but the vessel is then 100% occluded just distal to the stent at the takeoff of Diagonal 2  (Lesion #1) .    Diagonal 1: Small-caliber vessel with a proximal 80% tubular lesion; slight progression from original catheterization. The vessel after this has minimal luminal irregularities and covers a relatively large distribution.  Diagonal 2:  Small caliber vessel also with a proximal 80% tubular lesion, slightly more progression from her original catheterization; the vessel then continues with minimal luminal irregularities.  Diagonal 3 fills via diagonal 2 diagonal collaterals. The distal LAD fills via diagonal and OM 2 Diagonal collaterals as well as right to left collaterals the apex Following initial PTCA of the occlusion site, the diseased segment was well over 40 mm in length (Lesion #1) of followed by a brief "normal" segment.  This was followed by a 95% stenosis just distal to the takeoff of Diagonal 3 followed by a relatively diffusely diseased distal vessel (Lesion #2) Left Circumflex:  Moderate large caliber vessel that gives off a very proximal OM1 branches distally toward the apex. There is a roughly 40% lesion just distal to OM1 as the vessel courses the AV groove it gives off the terminal AV groove branch before terminating as OM 2 that also has a 40% lesion just distal to a branch point.  The remainder the circumflex vessels have minimal luminal irregularities.  RCA:  Very  large, also ectatic dominant vessel one small and another Large RV marginal branch that is followed by a roughly 30-40% lesion in the mid. The vessel then bifurcates into a moderate caliber extensive RPDA And Right Posterior AV Groove Branch (RPAV).  RPDA:  Moderate caliber vessel that fills the apical LAD both the distal aspect as well as via septal perforator branches; diffuse minimal luminal irregularities.  RPL Sysytem: RPAV  trifurcates into 3 posterior lateral branches with the AV nodal branch from the third branch; diffuse minimal luminal irregularities.  Abdominal Aortogram:    Abdominal aorta is normal in diameter with minimal irregularities. It bifurcates into the common iliac arteries without significant stenoses.  The celiac artery is widely patent with brisk flow into the hepatic and splenic arteries.  Bilateral renal arteries are widely patent vessels the bifurcate into both kidneys.  The SMA is a large-caliber vessel giving off several branches and is free of significant lesions as well.  After reviewing the initial cineangiography images, the culprit lesion(s) was identified, and the decision was made to proceed with percutaneous coronary intervention on what appears to be a chronically occluded LAD. Given the fact that there is ischemia in this distribution, revascularization as warranted to avoid potential ischemic cardiomyopathy.  Percutaneous Coronary Intervention:  Sheath exchanged for 6 Fr Guide: 6 Fr   XB LAD 3.5   Lesion #1   Guidewires:  Cougar--unsuccessful; Choice PT2 MS 300 (  for over-the-wire); extra-support "Sport" wire after initial PTCA Initial attempts to cross the what lesion with a cougar wire were unsuccessful despite the use of an Mini Trek rapid exchange (RX balloon.  The decision was then made to use an over-the-wire technique with a long wire supported by an over the wire balloon. With the Choice PT 2 wire and a Mini Trek 2.0 mm x 12 mm RX balloon the  lesion was successfully crossed and the wire placed into the distal vessel.     Predilation Balloon #1:  Mini Trek RX 2.0 mm x 12 mm;    10 Atm x 60 Sec x 2 in the proximal occlusion site at the stent and then just distal.  Post inflation angiography revealed persistently occluded vessel with no distal flow by collateral flow noted.  At this time the RX balloon was removed and exchanged for a over-the-wire balloon (OTW) to allow for one mL contrast injection through the balloon catheter lumen (once the wire was removed) which did reveal that the catheter and wire were in the distal LAD and a relatively normal segment just prior to the second diagonal branch.  The wire was then reintroduced in this balloon was also used in attempt to pre-dilate the lesion    Predilation Balloon #2:  Mini Trek OTW 2.0 mm x 15 mm;    10 Atm x 30 Sec x 2 in the proximal occlusion site at the stent and then just distal. Post inflation angiography revealed slightly improved distal flow after intracoronary nitroglycerin injection.    Predilation Balloon #3:  AngioSculpt RX 2.5 mm x 15 mm;    8 Atm x 50 Sec x 2 in the proximal occlusion site overlapping stent   Post inflation angiography revealed excellent distal TIMI-3 flow with extensive proximal to mid LAD lesions as well as a distal 95% lesion ( Lesion #2) as noted above. The decision was then made to use a longer balloon at the site to allow for stent passage distally as well as to perform PTCA on Lesion #2.   Predilation Balloon #4:  Trek RX 2.80mm x 20mm;    6 Atm x 30 Sec in the mid LAD  8 Atm x 30 Sec proximal to mid LAD Lesion #2:  Distal LAD beyond Diagonal 2 takeoff; tubular 95%  4 Atm x 60 Sec; final diameter 2.2 mm -- reduced to 10-20% residual stenosis. Intracoronary nitroglycerin glycerin injection  Post inflation angiography revealed excellent distal TIMI-3 flow with extensive proximal to mid LAD lesions as well as a distal 95% lesion as  noted above. At this time Stent #1 was attempted to be passed to the distal portion of Lesion #1, however this was unsuccessful due to a residual stenosis in the mid vessel. The decision was then made to use a noncompliant ([place]) balloon at the site to allow for stent passage distally.  Also, the Sport wire was advanced into the distal LAD to be exchanged for the long PT 2 wire at the time stent placement.   Predilation Balloon #5:  [place] Trek RX 2.29mm x 20mm;    12 Atm x 30 Sec in the mid LAD -- some residual "dog bone" noted to  16 Atm x 60 Sec proximal to mid LAD - with a final inflation to 16 atmospheres the "dog bone" appearance was eliminated.  12 Atm x 60 Sec proximal LAD overlapping into prior stent   Stent #1:  Xience Expedition RX 2.5 mm x 30 mm; to  distal portion of the lesion into the "normal segment " -- Advanced over the Sport wire; PT2 wire removed following deployment.  12 Atm x 45 Sec; final distal diameter 2.6 mm   Stent #2:  Xience Expedition RX 3.0 mm x 16 mm; overlapping Stent #1 into original BMS stent at SP1 and prior to Diagonal 1 (jailed by original stent) but "jailing"he previously jailed Diagonal 2   14 Atm x 30 Sec, 16 Atm x 45 Sec   Post-dilation Balloon: [place] Trek 3.25 mm x 20 mm;   8 Atm x 45 Sec -- into mid Stent #1,   12 Atm x 45 Sec -- at stent overlap  16 Atm x 45 Sec -- proximal Stent #2 into a overlap   Final Diameter:  Distal Stent #1 -- 2.6 mm; mid Stent #1  -- 3.0-3.25 mm;   overlap and Stent #2 - 3.34 mm   Post-deployment cineangiography with and without guidewire in place and following intracoronary nitroglycerin 2 was performed in multiple orthogonal views demonstrating excellent stent deployment and did not reveal evidence of dissection or perforation.  The distal PTCA site showed no signs of recoil with residual 10-20% stenosis and distal TIMI-3 flow in both the distal LAD and Diagonal 3.  TR Band:   1220 Hours,  13 mL air  ANESTHESIA:   Local  Lidocaine  2 ml SEDATION:  5 mg IV Versed,  150 mcg IV  Fentanyl; Premedication:  5 mg by mouth Valium MEDICATIONS: Intracoronary Nitroglycerin injection x6  Anticoagulation: for diagnostic -- IV heparin 5000 units; for PCI -- Angiomax bolus and drip discontinued in the PCI  Anti-Platelet Agent:  Brilinta 180 mg, aspirin additional 81 mg  EBL:   < 10 ml  PATIENT DISPOSITION:    The patient was transferred to the PACU holding area in a hemodynamicaly stable, chest pain free condition.  The patient tolerated the procedure well, and there were no complications.  The patient was stable before, during, and after the procedure.  POST-OPERATIVE DIAGNOSIS:    Chronic total occlusion of the proximal and mid LAD just distal to the previously placed bare-metal stent.  Status post successful complex PCI with 2 overlapping Xience Expedition DES stents as described below.  Residual moderate to severe lesions in both Diagonal 1 and Diagonal 2 that would potentially be PTCA only candidates if symptoms were to warrant.  Well-preserved Left Ventricular Ejection Fraction with normal EDP and no wall motion abnormalities.  Mild-to-moderate disease in the remaining coronary arteries.  No evidence of renal artery stenosis with widely patent abdominal aorta and branches.  PLAN OF CARE:  Overnight observation for standard post radial PCI care.  Dual antiplatelet therapy for minimum 1 year uninterrupted. He will start with Brilinta plus aspirin for at least the first month and may then be converted to Plavix if need be from a Physicist, medical standpoint. He'll be continued on dual antiplatelet therapy indefinitely.  He'll need to have his antihypertensive regimen adjusted as he has no evidence of renal artery stenosis to explain his hypertension.   He will followup initially with Dr. Jose[facility] and then with me going forward.    Marykay Lex, M.D., M.S. [facility]  [facility] [address] [place], [place]  [address]  [phone]  12/08/2011 9:35 PM

## 2011-12-09 ENCOUNTER — Encounter (HOSPITAL_COMMUNITY): Payer: Self-pay | Admitting: Cardiology

## 2011-12-09 DIAGNOSIS — Z95820 Peripheral vascular angioplasty status with implants and grafts: Secondary | ICD-10-CM

## 2011-12-09 HISTORY — DX: Peripheral vascular angioplasty status with implants and grafts: Z95.820

## 2011-12-09 LAB — CBC
HCT: 40.1 % (ref 39.0–52.0)
Hemoglobin: 13.9 g/dL (ref 13.0–17.0)
MCHC: 34.7 g/dL (ref 30.0–36.0)

## 2011-12-09 LAB — GLUCOSE, CAPILLARY
Glucose-Capillary: 144 mg/dL — ABNORMAL HIGH (ref 70–99)
Glucose-Capillary: 180 mg/dL — ABNORMAL HIGH (ref 70–99)

## 2011-12-09 LAB — BASIC METABOLIC PANEL
BUN: 13 mg/dL (ref 6–23)
GFR calc non Af Amer: 85 mL/min — ABNORMAL LOW (ref >90)
Glucose, Bld: 152 mg/dL — ABNORMAL HIGH (ref 70–99)
Potassium: 4.1 mEq/L (ref 3.5–5.1)

## 2011-12-09 MED ORDER — NITROGLYCERIN 0.4 MG SL SUBL: 0.4000 mg | SUBLINGUAL_TABLET | SUBLINGUAL | Status: DC | PRN

## 2011-12-09 MED ORDER — ACETAMINOPHEN 325 MG PO TABS: 650.0000 mg | ORAL_TABLET | ORAL | Status: AC | PRN

## 2011-12-09 MED ORDER — TICAGRELOR 90 MG PO TABS: 90.0000 mg | ORAL_TABLET | Freq: Two times a day (BID) | ORAL | Status: DC

## 2011-12-09 MED FILL — Dextrose Inj 5%: INTRAVENOUS | Qty: 50 | Status: AC

## 2011-12-09 NOTE — Progress Notes (Signed)
Pt. Seen and examined. Agree with the NP/PA-C note as written. Stable post-procedure. No chest pain. Ok for d/c today. Radial site looks good without hematoma. Intact radial pulse. Follow-up with Dr. Clarene Duke.  Chrystie Nose, MD, [facility] Attending Cardiologist [facility]

## 2011-12-09 NOTE — Progress Notes (Signed)
CARDIAC REHAB PHASE I   PRE:  Rate/Rhythm: 64SR  BP:  Supine:   Sitting: 178/74  Standing:    SaO2:   MODE:  Ambulation: 700 ft   POST:  Rate/Rhythem: 76SR  BP:  Supine:   Sitting: 173/72  Standing:    SaO2:  0908-1005 Pt walked 700 ft on RA with steady gait. No CP. Education completed with pt and wife. Declined CRP 2.Hydia Copelin DunlapRN  Duanne Limerick

## 2011-12-09 NOTE — Care Management Note (Signed)
    Page 1 of 1   12/09/2011     10:09:13 AM   CARE MANAGEMENT NOTE 12/09/2011  Patient:  [name], [name]   Account Number:  [id number]  Date Initiated:  12/09/2011  Documentation initiated by:  GRAVES-BIGELOW,Mykaylah Ballman  Subjective/Objective Assessment:   Pt admitted with cp. S/p PCI plan for home on bilinta. CM did Lyons benefits check and will make pt aware of co pay cost once complete.     Action/Plan:   CM called [facility] on [address] and co pay will be 40.00. CM will provide pt with Lyons 30 day free card. MD please write Rx for 30 day free no refills and then the original with refills. No further needs from CM at this time.   Anticipated DC Date:  12/09/2011   Anticipated DC Plan:  HOME/SELF CARE      DC Planning Services  Medication Assistance  CM consult      Choice offered to / List presented to:             Status of service:  Completed, signed off Medicare Important Message given?   (If response is "NO", the following Medicare IM given date fields will be blank) Date Medicare IM given:   Date Additional Medicare IM given:    Discharge Disposition:  HOME/SELF CARE  Per UR Regulation:  Reviewed for med. necessity/level of care/duration of stay  If discussed at Long Length of Stay Meetings, dates discussed:    Comments:

## 2011-12-09 NOTE — Progress Notes (Signed)
Subjective: No complaints, no chest pain.  Objective: Vital signs in last 24 hours: Temp:  [97.3 F (36.3 C)-98.1 F (36.7 C)] 97.9 F (36.6 C) (07/18 0731) Pulse Rate:  [52-65] 58  (07/18 0731) Resp:  [13-21] 20  (07/18 0731) BP: (110-181)/(42-78) 157/66 mmHg (07/18 0731) SpO2:  [96 %-100 %] 97 % (07/18 0731) Weight:  [106.9 kg (235 lb 10.8 oz)] 106.9 kg (235 lb 10.8 oz) (07/18 0600) Weight change:  Last BM Date: 12/07/11 Intake/Output from previous day: +240 07/17 0701 - 07/18 0700 In: 240 [P.O.:240] Out: -  Intake/Output this shift: Total I/O In: 360 [P.O.:360] Out: -   PE: General:alert and oriented X 3, MAE, follows commands Heart:S1S2 RRR Lungs:clear without rales, rhonchi or wheezes Abd:+ BS soft , non tender Ext:No edema. 2+ Radial rt. Pulse, cath site stable.  bil feet warm 2+ pedal pulses Neuro:alert and oriented X 3, MAE, follows commands   Lab Results:  Basename 12/09/11 0532  WBC 8.1  HGB 13.9  HCT 40.1  PLT 179   BMET  Basename 12/09/11 0532  NA 140  K 4.1  CL 103  CO2 25  GLUCOSE 152*  BUN 13  CREATININE 0.84  CALCIUM 9.1     EKG: Orders placed during the hospital encounter of 12/08/11  . EKG 12-LEAD  . EKG 12-LEAD  . EKG 12-LEAD  . EKG 12-LEAD  . EKG 12-LEAD  . EKG 12-LEAD    Studies/Results: Cardiac cath and Intervention  12/08/11: PROCEDURES PERFORMED:  LEFT HEART CATHETERIZATION WITH CORONARY ANGIOGRAM (N/A)  RENAL ANGIOGRAM (N/A)  COMPLEX, DIFFICULT PERCUTANEOUS CORONARY STENT INTERVENTION (PCI-S) of a Chronically Occluded Prox-Mid LAD with 2 Overlapping DES Xience Expedition 2.5 mm x 38 mm distal (final diameter ~2.6 mm distal, 3.1 mm mid) and 3.0 mm x 16 mm proximal into previous stent (final diameter ~ 3.34 mm)  Balloon Angioplasty of the Distal LAD 95% lesion --> reducing the stenosis to ~10-20%  Widely patent bilateral Renal Arteries.  IC Nitroglycerin injection 200 mcg x 6  POST-OPERATIVE DIAGNOSIS:  Moderate LCx and  RCA Disease  100%, Chronically occluded late Prox to Mid LAD at the distal end of the previously placed BMS stent. Long complex lesion before a "normal segment" well before D4. ~95% Distal LAD lesion just after D4. Both D1 and D2 have ostial/proximal ~80% lesions, but TIMI 3 flow noted.  Well preserved EF of ~55-60%, LVEDP ~18 mmHg  No evidence of Renal artery stenosis.    Medications: I have reviewed the patient's current medications.    Marland Kitchen acetaminophen  500 mg Oral QHS  . aliskiren  150 mg Oral Daily  . amLODipine  5 mg Oral BID  . aspirin  81 mg Oral Daily  . atenolol  50 mg Oral BID  . atorvastatin  40 mg Oral QHS  . bivalirudin      . diphenhydrAMINE  25 mg Oral QHS  . fentaNYL      . fentaNYL      . fluticasone  1 spray Each Nare BID  . glipiZIDE  10 mg Oral BID AC  . heparin      . hydrALAZINE  50 mg Oral BID  . insulin aspart  0-15 Units Subcutaneous TID WC  . insulin aspart  0-5 Units Subcutaneous QHS  . irbesartan  300 mg Oral Daily  . linagliptin  5 mg Oral Daily  . lisinopril  20 mg Oral BID  . midazolam      . midazolam      .  midazolam      . pantoprazole  40 mg Oral Q1200  . Ticagrelor      . Ticagrelor  90 mg Oral BID  . verapamil      . DISCONTD: diphenhydramine-acetaminophen  1 tablet Oral QHS  . DISCONTD: NON FORMULARY 20 mg  20 mg Oral QHS  . DISCONTD: sodium chloride  3 mL Intravenous Q12H   Assessment/Plan: Patient Active Problem List  Diagnosis  . Abnormal nuclear stress test - High risk; medium sized, moderate to severe defect in mid Anteroseptal, apical septal and anterior walls  . Shortness of breath on exertion  . CAD (coronary artery disease), native coronary artery, moderate Lcx and RCA disease by cath 12/08/11  . Presence of bare metal stent in LAD coronary artery -- 3.0 mm x 15 mm  . Dyslipidemia (high LDL; low HDL)  . Hypertension, uncontrolled  . OSA on CPAP  . Diabetes mellitus type 2, noninsulin dependent  . PAF (paroxysmal atrial  fibrillation)  . History of hematuria  . S/P angioplasty with stent, placement of 2 overlapping DES Xience stents, to prox. Mid Lad and PTCA of distal LAD.  12/08/11   PLAN: EKG S. Brady no acute changes. Labs stable post PCI.  Hold metformin for 48 hours post cath.  MD to see and cardiac rehab to ambulate if stable D/C home and follow up with Dr.Little.  LOS: 1 day   Darcia Lampi R 12/09/2011, 9:05 AM

## 2011-12-09 NOTE — Progress Notes (Signed)
Inpatient Diabetes Program Recommendations  AACE/ADA: New Consensus Statement on Inpatient Glycemic Control  Target Ranges:  Prepandial:   less than 140 mg/dL      Peak postprandial:   less than 180 mg/dL (1-2 hours)      Critically ill patients:  140 - 180 mg/dL  Pager:  [phone] Hours:  8 am-10pm   Reason for Visit: History of Diabetes  Inpatient Diabetes Program Recommendations HgbA1C: Check HgbA1C to assess glycemic control  Alfredia Client PhD, RN Diabetes Coordinator  Office:  [phone] Team Pager:  [phone]

## 2011-12-10 NOTE — Discharge Summary (Signed)
Physician Discharge Summary  Patient ID: [name] MRN: [id number] DOB/AGE: February 04, 1941 71 y.o.  Admit date: 12/08/2011 Discharge date: 12/09/2011  Discharge Diagnoses:  Principal Problem:  *Abnormal nuclear stress test - High risk; medium sized, moderate to severe defect in mid Anteroseptal, apical septal and anterior walls Active Problems:  CAD (coronary artery disease), native coronary artery, moderate Lcx and RCA disease by cath 12/08/11  S/P angioplasty with stent, placement of 2 overlapping DES Xience stents, to prox. Mid Lad and PTCA of distal LAD.  12/08/11  Shortness of breath on exertion  Dyslipidemia (high LDL; low HDL)  Hypertension, uncontrolled  OSA on CPAP  Diabetes mellitus type 2, noninsulin dependent  Presence of bare metal stent in LAD coronary artery -- 3.0 mm x 15 mm   Discharged Condition: good  PROCEDURES: 12/08/2011 Lt. Heart cath and Renal arteriogram by Dr. Herbie Baltimore. 12/08/2011   LEFT HEART CATHETERIZATION WITH CORONARY ANGIOGRAM (N/A) RENAL ANGIOGRAM (N/A) COMPLEX, DIFFICULT PERCUTANEOUS CORONARY STENT INTERVENTION (PCI-S) of a Chronically occluded Prox-Mid LAD with 2 Overlapping DES Xience Expedition 2.5 mm x 38 mm distal (final diameter ~2.6 mm distal, 3.1 mm mid) and 3.0 mm x 16 mm proximal into previous stent (final diameter ~ 3.34 mm) Balloon Angioplasty of the Distal LAD 95% lesion --> reducing the stenosis to ~10-20% Widely patent bilateral Renal Arteries.   Hospital Course: [name] is a 71 y.o. male with a history of PMH below -- notably BMS PCI to the proximal LAD in 06/2010 by DR. Little. As part of routine follow-up, he saw Dr. Clarene Duke in clinic. He did note having some exertional shortness of breath, so a TM Myoview Nuclear ST was ordered to evaluate for potential coronary ischemia as the cause of his shortness of breath. He has not noted angina, but did not have angina prior the the previous PCI in 2002. The test was performed on 12/01/2011 -- he  walked ~8 minutes & did develop shortness of breath along with some mild chest discomfort as a reason for stopping. There were down-sloping ST-T segment depressions at peak stress that were positive for ischemia that were confirmed by the imaging study demonstrating essentially LAD distribution ischemia. He was referred for invasive coronary evaluation by cardiac catheterization.  Also, due to his very difficult to control hypertension - on multiple medications, he is also referred for Renal Artery angiography to assess for potential Renal Artery Stenosis as a potential etiology.  Pt underwent cardiac cath and renal arteriogram: POST-OPERATIVE DIAGNOSIS:  Moderate LCx and RCA Disease  100%, Chronically occluded late Prox to Mid LAD at the distal end of the previously placed BMS stent. Long complex lesion before a "normal segment" well before D4. ~95% Distal LAD lesion just after D4. Both D1 and D2 have ostial/proximal ~80% lesions, but TIMI 3 flow noted.  Well preserved EF of ~55-60%, LVEDP ~18 mmHg  No evidence of Renal artery stenosis.  PROCEDURE: Procedure(s) (LRB):  LEFT HEART CATHETERIZATION WITH CORONARY ANGIOGRAM (N/A)  RENAL ANGIOGRAM (N/A)  COMPLEX, DIFFICULT PERCUTANEOUS CORONARY STENT INTERVENTION (PCI-S) of a Chronically occluded Prox-Mid LAD with 2 Overlapping DES Xience Expedition 2.5 mm x 38 mm distal (final diameter ~2.6 mm distal, 3.1 mm mid) and 3.0 mm x 16 mm proximal into previous stent (final diameter ~ 3.34 mm)  Balloon Angioplasty of the Distal LAD 95% lesion --> reducing the stenosis to ~10-20%  Widely patent bilateral Renal Arteries.  IC Nitroglycerin injection 200 mcg x 6   Pt. Tolerated the procedures without  complications.  By the next AM he was stable and ready for discharge.  Ambulated without complications.  Seen by Dr. Rennis Golden -stable and discharged.   Consults: None  Significant Diagnostic Studies: Na 140 K+ 4.1 Chloride 25 BUN 14  Cr. 0.84   Hgb 13.9  HCT 40.1   EKG without acute changes.   2 view chest x-ray without acute changes.  Discharge Exam: Blood pressure 157/66, pulse 58, temperature 97.9 F (36.6 C), temperature source Oral, resp. rate 20, height 6' (1.829 m), weight 106.9 kg (235 lb 10.8 oz), SpO2 97.00%.  General:alert and oriented X 3, MAE, follows commands  Heart:S1S2 RRR  Lungs:clear without rales, rhonchi or wheezes  Abd:+ BS soft , non tender  Ext:No edema. 2+ Radial rt. Pulse, cath site stable. bil feet warm 2+ pedal pulses  Neuro:alert and oriented X 3, MAE, follows commands    Disposition: 01-Home or Self Care   Medication List  As of 12/10/2011 12:49 PM   TAKE these medications         acetaminophen 325 MG tablet   Commonly known as: TYLENOL   Take 2 tablets (650 mg total) by mouth every 4 (four) hours as needed.      amLODipine 5 MG tablet   Commonly known as: NORVASC   Take 5 mg by mouth 2 (two) times daily.      aspirin EC 81 MG tablet   Take 81 mg by mouth daily.      atenolol 50 MG tablet   Commonly known as: TENORMIN   Take 50 mg by mouth 2 (two) times daily.      beta carotene w/minerals tablet   Take 1 tablet by mouth daily.      Co Q-10 200 MG Caps   Take 1 tablet by mouth daily.      diphenhydramine-acetaminophen 25-500 MG Tabs   Commonly known as: TYLENOL PM   Take 1 tablet by mouth at bedtime.      fluticasone 50 MCG/ACT nasal spray   Commonly known as: FLONASE   Place 1 spray into both nostrils 2 (two) times daily.      glipiZIDE 10 MG tablet   Commonly known as: GLUCOTROL   Take 10 mg by mouth 2 (two) times daily before a meal.      hydrALAZINE 50 MG tablet   Commonly known as: APRESOLINE   Take 50 mg by mouth 2 (two) times daily.      L-Carnitine 500 MG Caps   Take 1 capsule by mouth daily.      lisinopril 20 MG tablet   Commonly known as: PRINIVIL,ZESTRIL   Take 20 mg by mouth 2 (two) times daily.      MENS PROSTATE HEALTH FORMULA PO   Take 1 tablet by mouth 2 (two) times  daily.      metFORMIN 500 MG tablet   Commonly known as: GLUCOPHAGE   Take 1,000 mg by mouth 2 (two) times daily with a meal.      Milk Thistle 250 MG Caps   Take 1 capsule by mouth daily.      nitroGLYCERIN 0.4 MG SL tablet   Commonly known as: NITROSTAT   Place 1 tablet (0.4 mg total) under the tongue every 5 (five) minutes as needed for chest pain.      omeprazole 20 MG capsule   Commonly known as: PRILOSEC   Take 20 mg by mouth daily.      Potassium 99 MG Tabs  Take 1 tablet by mouth daily.      RASPBERRY KETONES PO   Take 1 tablet by mouth daily.      rosuvastatin 20 MG tablet   Commonly known as: CRESTOR   Take 20 mg by mouth daily.      sitaGLIPtin 100 MG tablet   Commonly known as: JANUVIA   Take 100 mg by mouth daily.      TEKTURNA 150 MG tablet   Generic drug: aliskiren   Take 150 mg by mouth daily.      telmisartan 80 MG tablet   Commonly known as: MICARDIS   Take 80 mg by mouth 2 (two) times daily.      Ticagrelor 90 MG Tabs tablet   Commonly known as: BRILINTA   Take 1 tablet (90 mg total) by mouth 2 (two) times daily.           Follow-up Information    Follow up with LITTLE, ALFRED B, MD on 12/23/2011. (at 10:00AM)    Contact information:   [address] [place] [address] [phone]        Discharge Instructions: Call [facility] if any bleeding, swelling or drainage at cath site.  May shower, no tub baths for 48 hours for groin sticks.  No driving for 3 days. No lifting for 1 week with Rt. Wrist. Take 1 NTG, under your tongue, while sitting.  If no relief of pain may repeat NTG, one tab every 5 minutes up to 3 tablets total over 15 minutes.  If no relief CALL 911.  If you have dizziness/lightheadness  while taking NTG, stop taking and call 911.       Heart Healthy Diabetic diet.  DO NOT TAKE METFORMIN UNTIL 12/11/11, IT MAY INTERACT WITH CATH DYE.  SignedLeone Brand 12/10/2011, 12:49 PM

## 2011-12-12 ENCOUNTER — Emergency Department (HOSPITAL_COMMUNITY): Payer: Medicare Other

## 2011-12-12 ENCOUNTER — Emergency Department (HOSPITAL_COMMUNITY)
Admission: EM | Admit: 2011-12-12 | Discharge: 2011-12-13 | Disposition: A | Discharge status: Home / Self Care | Payer: Medicare Other | Attending: Emergency Medicine | Admitting: Emergency Medicine

## 2011-12-12 DIAGNOSIS — I10 Essential (primary) hypertension: Secondary | ICD-10-CM | POA: Diagnosis present

## 2011-12-12 DIAGNOSIS — Z95820 Peripheral vascular angioplasty status with implants and grafts: Secondary | ICD-10-CM

## 2011-12-12 DIAGNOSIS — E785 Hyperlipidemia, unspecified: Secondary | ICD-10-CM | POA: Diagnosis present

## 2011-12-12 DIAGNOSIS — G4733 Obstructive sleep apnea (adult) (pediatric): Secondary | ICD-10-CM | POA: Diagnosis present

## 2011-12-12 DIAGNOSIS — I2510 Atherosclerotic heart disease of native coronary artery without angina pectoris: Secondary | ICD-10-CM | POA: Diagnosis present

## 2011-12-12 DIAGNOSIS — Z9089 Acquired absence of other organs: Secondary | ICD-10-CM | POA: Insufficient documentation

## 2011-12-12 DIAGNOSIS — E119 Type 2 diabetes mellitus without complications: Secondary | ICD-10-CM | POA: Diagnosis present

## 2011-12-12 DIAGNOSIS — R0602 Shortness of breath: Secondary | ICD-10-CM | POA: Insufficient documentation

## 2011-12-12 DIAGNOSIS — Z9989 Dependence on other enabling machines and devices: Secondary | ICD-10-CM | POA: Diagnosis present

## 2011-12-12 DIAGNOSIS — K219 Gastro-esophageal reflux disease without esophagitis: Secondary | ICD-10-CM | POA: Insufficient documentation

## 2011-12-12 DIAGNOSIS — I480 Paroxysmal atrial fibrillation: Secondary | ICD-10-CM | POA: Diagnosis present

## 2011-12-12 DIAGNOSIS — G4730 Sleep apnea, unspecified: Secondary | ICD-10-CM | POA: Insufficient documentation

## 2011-12-12 DIAGNOSIS — Z79899 Other long term (current) drug therapy: Secondary | ICD-10-CM | POA: Insufficient documentation

## 2011-12-12 DIAGNOSIS — M79609 Pain in unspecified limb: Secondary | ICD-10-CM | POA: Insufficient documentation

## 2011-12-12 LAB — CBC WITH DIFFERENTIAL/PLATELET
Basophils Absolute: 0 10*3/uL (ref 0.0–0.1)
Basophils Relative: 0 % (ref 0–1)
Eosinophils Relative: 4 % (ref 0–5)
Lymphocytes Relative: 19 % (ref 12–46)
MCHC: 35.7 g/dL (ref 30.0–36.0)
MCV: 85 fL (ref 78.0–100.0)
Neutro Abs: 4.6 10*3/uL (ref 1.7–7.7)
Platelets: 182 10*3/uL (ref 150–400)
RDW: 12.7 % (ref 11.5–15.5)
WBC: 6.6 10*3/uL (ref 4.0–10.5)

## 2011-12-12 LAB — PRO B NATRIURETIC PEPTIDE: Pro B Natriuretic peptide (BNP): 42.4 pg/mL (ref 0–125)

## 2011-12-12 LAB — POCT I-STAT, CHEM 8
BUN: 20 mg/dL (ref 6–23)
Calcium, Ion: 1.22 mmol/L (ref 1.13–1.30)
Chloride: 105 mEq/L (ref 96–112)
Glucose, Bld: 232 mg/dL — ABNORMAL HIGH (ref 70–99)
TCO2: 22 mmol/L (ref 0–100)

## 2011-12-12 LAB — D-DIMER, QUANTITATIVE: D-Dimer, Quant: 1.76 ug/mL-FEU — ABNORMAL HIGH (ref 0.00–0.48)

## 2011-12-12 LAB — POCT I-STAT TROPONIN I: Troponin i, poc: 0.02 ng/mL (ref 0.00–0.08)

## 2011-12-12 MED ORDER — IOHEXOL 350 MG/ML SOLN
100.0000 mL | Freq: Once | INTRAVENOUS | Status: AC | PRN
  Administered 2011-12-12: 100 mL via INTRAVENOUS

## 2011-12-12 MED ORDER — SODIUM CHLORIDE 0.9 % IV BOLUS (SEPSIS)
500.0000 mL | Freq: Once | INTRAVENOUS | Status: AC
  Administered 2011-12-12: 500 mL via INTRAVENOUS

## 2011-12-12 MED ORDER — ONDANSETRON HCL 4 MG/2ML IJ SOLN
4.0000 mg | Freq: Once | INTRAMUSCULAR | Status: AC
  Administered 2011-12-12: 4 mg via INTRAVENOUS

## 2011-12-12 MED ORDER — INSULIN ASPART 100 UNIT/ML ~~LOC~~ SOLN: 4.0000 [IU] | Freq: Once | SUBCUTANEOUS | Status: DC

## 2011-12-12 MED ORDER — ONDANSETRON HCL 4 MG/2ML IJ SOLN
INTRAMUSCULAR | Status: AC
  Filled 2011-12-12: qty 2

## 2011-12-12 NOTE — ED Notes (Signed)
12/08/11: x 2 Stents in LAd (mid and prox). After d/c, sob and is on cpap. Thought nothing of it. When he would dose off, would become sob. Orthopnea. Last night: took tylenol pm and melatonin and woke up 0400 with sob with no relief from CPAP. Also, experienced lightheadedness. Called pcp and told to come to ED. Feels all right now.  CBG > 200. Am bp 177/84, hr 56 (below his normal 60-62)

## 2011-12-12 NOTE — ED Notes (Signed)
Family at bedside. 
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
## 2011-12-12 NOTE — ED Notes (Addendum)
MD at bedside. DR. Fonnie Jarvis aware of pt. Exp. Near syncope after IV placed. He states, "experiences this near syncope when an IV is placed. " pt. Placed in trendelenburg and hob dec. To 30 degrees. HR 40's - pt. Concerned that his heart rate is being regulated at a lower rate than normal.

## 2011-12-13 NOTE — ED Provider Notes (Signed)
Medical screening examination/treatment/procedure(s) were conducted as a shared visit with non-physician practitioner(s) and myself.  I personally evaluated the patient during the encounter  Pt was planning on stopping Brilinta.  Hurman Horn, MD 12/13/11 2046
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
## 2012-04-24 ENCOUNTER — Other Ambulatory Visit: Payer: Self-pay | Admitting: Dermatology

## 2014-05-02 ENCOUNTER — Encounter (HOSPITAL_COMMUNITY): Payer: Self-pay | Admitting: Cardiology

## 2014-08-15 ENCOUNTER — Other Ambulatory Visit: Payer: Self-pay | Admitting: Gastroenterology

## 2015-06-05 ENCOUNTER — Ambulatory Visit (INDEPENDENT_AMBULATORY_CARE_PROVIDER_SITE_OTHER): Payer: Medicare Other | Admitting: Internal Medicine

## 2015-06-05 ENCOUNTER — Encounter: Payer: Self-pay | Admitting: Internal Medicine

## 2015-06-05 VITALS — BP 134/64 | HR 63 | Ht 72.0 in | Wt 235.4 lb

## 2015-06-05 DIAGNOSIS — R058 Other specified cough: Secondary | ICD-10-CM

## 2015-06-05 DIAGNOSIS — R05 Cough: Secondary | ICD-10-CM | POA: Diagnosis not present

## 2015-06-05 MED ORDER — PREDNISONE 10 MG PO TABS: ORAL_TABLET | ORAL | Status: DC

## 2015-06-05 MED ORDER — BISOPROLOL FUMARATE 5 MG PO TABS: ORAL_TABLET | ORAL | Status: DC

## 2015-06-05 MED ORDER — OMEPRAZOLE 40 MG PO CPDR: DELAYED_RELEASE_CAPSULE | ORAL | Status: DC

## 2015-06-05 NOTE — Patient Instructions (Addendum)
Stop tenomin and start bisprolol 5 mg twice daily instead  Prednisone 10 mg take  4 each am x 2 days,   2 each am x 2 days,  1 each am x 2 days and stop   Omeprazole 40 mg Take 30- 60 min before your first and last meals of the day   GERD (REFLUX)  is an extremely common cause of respiratory symptoms just like yours , many times with no obvious heartburn at all.    It can be treated with medication, but also with lifestyle changes including elevation of the head of your bed (ideally with 6 inch  bed blocks),  Smoking cessation, avoidance of late meals, excessive alcohol, and avoid fatty foods, chocolate, peppermint, colas, red wine, and acidic juices such as orange juice.  NO MINT OR MENTHOL PRODUCTS SO NO COUGH DROPS  USE SUGARLESS CANDY INSTEAD (Jolley ranchers or Stover's or Life Savers) or even ice chips will also do - the key is to swallow to prevent all throat clearing. NO OIL BASED VITAMINS - use powdered substitutes.    Please see patient coordinator before you leave today  to schedule diagnostic esophogram   Please schedule a follow up office visit in 4 weeks, sooner if needed

## 2015-06-05 NOTE — Progress Notes (Signed)
Subjective:     Patient ID: [name], male   DOB: [birth year],    MRN: [id number]  HPI  [age] yowm never smoker with sensation of pnds/cough dating back to childhood getting worse since 2013 with a neg w/u at [facility] (nl pfts) and no  resp to qvar / prilosec so referred to pulmonary clinic 06/05/2015 by Dr Catha GosselinKevin Little for persistent cough   06/05/2015 1st [place] [facility] office visit/ [name]  Cough x 70 y / worse x 3   Chief Complaint  Patient presents with  . Pulmonary Consult    Ref Dr. Catha GosselinKevin Little for recurrent cough. c/o prod cough with milky clear musuc. also wheezing,. denies any sob, cp/ tightness. Using CPAP machine sleeping 8-9 hrs nightly.   def connection to swallowing esp p supper p taking prilosec 20 taken p breakfast.    [name] Reflux v Neurogenic Cough Differentiator Reflux Comments  Do you awaken from a sound sleep coughing violently?                            With trouble breathing? no   Do you have choking episodes when you cannot  Get enough air, gasping for air ?              rarely   Do you usually cough when you lie down into  The bed, or when you just lie down to rest ?                          No    Do you usually cough after meals or eating?         Yes   Do you cough when (or after) you bend over?    no   GERD SCORE     [name] Reflux v Neurogenic Cough Differentiator Neurogenic   Do you more-or-less cough all day long? sporadic   Does change of temperature make you cough? no   Does laughing or chuckling cause you to cough? maybe   Do fumes (perfume, automobile fumes, burned  Toast, etc.,) cause you to cough ?      no   Does speaking, singing, or talking on the phone cause you to cough   ?               Speaking    Neurogenic/Airway score      After vigorous coughing maybe a tbsp cloudy/white mucus  No obvious day to day or daytime variability or assoc sob or cp or chest tightness, subjective wheeze or overt sinus or hb symptoms. No unusual exp hx or  h/o childhood pna/ asthma or knowledge of premature birth.  Sleeping ok without nocturnal  or early am exacerbation  of respiratory  c/o's or need for noct saba. Also denies any obvious fluctuation of symptoms with weather or environmental changes or other aggravating or alleviating factors except as outlined above   Current Medications, Allergies, Complete Past Medical History, Past Surgical History, Family History, and Social History were reviewed in [facility] electronic medical record.  ROS  The following are not active complaints unless bolded sore throat, dysphagia, dental problems, itching, sneezing,  nasal congestion or excess/ purulent secretions, ear ache,   fever, chills, sweats, unintended wt loss, classically pleuritic or exertional cp, hemoptysis,  orthopnea pnd or leg swelling, presyncope, palpitations, abdominal pain, anorexia, nausea, vomiting, diarrhea  or change in bowel or bladder  habits, change in stools or urine, dysuria,hematuria,  rash, arthralgias, visual complaints, headache, numbness, weakness or ataxia or problems with walking or coordination,  change in mood/affect or memory.              Review of Systems     Objective:   Physical Exam amb wm nad  Wt Readings from Last 3 Encounters:  06/05/15 235 lb 6.4 oz (106.777 kg)  12/09/11 235 lb 10.8 oz (106.9 kg)    Vital signs reviewed   HEENT: nl dentition, turbinates, and oropharynx. Nl external ear canals without cough reflex   NECK :  without JVD/Nodes/TM/ nl carotid upstrokes bilaterally   LUNGS: no acc muscle use,  insp and exp rhonchi with lots of transmitted upper airway noise    CV:  RRR  no s3 or murmur or increase in P2, no edema   ABD:  soft and nontender with nl inspiratory excursion in the supine position. No bruits or organomegaly, bowel sounds nl  MS:  Nl gait/ ext warm without deformities, calf tenderness, cyanosis or clubbing No obvious joint restrictions   SKIN: warm and dry  without lesions    NEURO:  alert, approp, nl sensorium with  no motor deficits        Assessment:

## 2015-06-06 ENCOUNTER — Encounter: Payer: Self-pay | Admitting: Internal Medicine

## 2015-06-06 NOTE — Assessment & Plan Note (Signed)
The most common causes of chronic cough in immunocompetent adults include the following: upper airway cough syndrome (UACS), previously referred to as postnasal drip syndrome (PNDS), which is caused by variety of rhinosinus conditions; (2) asthma; (3) GERD; (4) chronic bronchitis from cigarette smoking or other inhaled environmental irritants; (5) nonasthmatic eosinophilic bronchitis; and (6) bronchiectasis.   These conditions, singly or in combination, have accounted for up to 94% of the causes of chronic cough in prospective studies.   Other conditions have constituted no >6% of the causes in prospective studies These have included bronchogenic carcinoma, chronic interstitial pneumonia, sarcoidosis, left ventricular failure, ACEI-induced cough, and aspiration from a condition associated with pharyngeal dysfunction.    Chronic cough is often simultaneously caused by more than one condition. A single cause has been found from 38 to 82% of the time, multiple causes from 18 to 62%. Multiply caused cough has been the result of three diseases up to 42% of the time.       Based on hx and exam, this is most likely:  Cough variant asthma exac by high dose atenolol  With major component of   Upper airway cough syndrome, so named because it's frequently impossible to sort out how much is  CR/sinusitis with freq throat clearing (which can be related to primary GERD)   vs  causing  secondary (" extra esophageal")  GERD from wide swings in gastric pressure that occur with throat clearing, often  promoting self use of mint and menthol lozenges that reduce the lower esophageal sphincter tone and exacerbate the problem further in a cyclical fashion.   These are the same pts (now being labeled as having "irritable larynx syndrome" by some cough centers) who not infrequently have a history of having failed to tolerate ace inhibitors,  dry powder inhalers or biphosphonates or report having atypical reflux symptoms that  don't respond to standard doses of PPI , and are easily confused as having aecopd or asthma flares by even experienced allergists/ pulmonologists.   The first step is to maximize acid suppression / eval swallowing with esophogram, and change atenolol to bisoprolol  then regroup in 4 weeks and go from there  I had an extended discussion with the patient reviewing all relevant studies completed to date and  lasting 35 min/ 60 min initial ov   1) Explained: The standardized cough guidelines published in Chest by [employer] Irwin in 2006 are still the best available and consist of a multiple step process (up to 12!) , not a single office visit,  and are intended  to address this problem logically,  with an alogrithm dependent on response to empiric treatment at  each progressive step  to determine a specific diagnosis with  minimal addtional testing needed. Therefore if adherence is an issue or can't be accurately verified,  it's very unlikely the standard evaluation and treatment will be successful here.    Furthermore, response to therapy (other than acute cough suppression, which should only be used short term with avoidance of narcotic containing cough syrups if possible), can be a gradual process for which the patient may perceive immediate benefit.  Unlike going to an eye doctor where the best perscription is almost always the first one and is immediately effective, this is almost never the case in the management of chronic cough syndromes. Therefore the patient needs to commit up front to consistently adhere to recommendations  for up to 6 weeks of therapy directed at the likely underlying problem(s) before the  response can be reasonably evaluated.     2) Each maintenance medication was reviewed in detail including most importantly the difference between maintenance and prns and under what circumstances the prns are to be triggered using an action plan format that is not reflected in the computer  generated alphabetically organized AVS.    Please see instructions for details which were reviewed in writing and the patient given a copy highlighting the part that I personally wrote and discussed at today's ov.   See instructions for specific recommendations which were reviewed directly with the patient who was given a copy with highlighter outlining the key components.

## 2015-06-17 ENCOUNTER — Ambulatory Visit (HOSPITAL_COMMUNITY)
Admission: RE | Admit: 2015-06-17 | Discharge: 2015-06-17 | Disposition: A | Discharge status: Home / Self Care | Payer: Medicare Other | Source: Ambulatory Visit | Attending: Internal Medicine | Admitting: Internal Medicine

## 2015-06-17 DIAGNOSIS — K449 Diaphragmatic hernia without obstruction or gangrene: Secondary | ICD-10-CM | POA: Insufficient documentation

## 2015-06-17 DIAGNOSIS — R05 Cough: Secondary | ICD-10-CM | POA: Diagnosis present

## 2015-06-17 DIAGNOSIS — K225 Diverticulum of esophagus, acquired: Secondary | ICD-10-CM | POA: Insufficient documentation

## 2015-06-17 DIAGNOSIS — R058 Other specified cough: Secondary | ICD-10-CM

## 2015-06-20 ENCOUNTER — Ambulatory Visit: Payer: Medicare Other | Admitting: Internal Medicine

## 2015-06-24 ENCOUNTER — Encounter: Payer: Self-pay | Admitting: Internal Medicine

## 2015-06-24 ENCOUNTER — Ambulatory Visit (INDEPENDENT_AMBULATORY_CARE_PROVIDER_SITE_OTHER): Payer: Medicare Other | Admitting: Internal Medicine

## 2015-06-24 VITALS — BP 130/72 | HR 78 | Ht 72.0 in | Wt 234.0 lb

## 2015-06-24 DIAGNOSIS — R05 Cough: Secondary | ICD-10-CM | POA: Diagnosis not present

## 2015-06-24 DIAGNOSIS — R058 Other specified cough: Secondary | ICD-10-CM

## 2015-06-24 NOTE — Progress Notes (Signed)
Subjective:     Patient ID: [name], male   DOB: 1940/08/17,    MRN: [id number]    Brief patient profile:  72 yowm never smoker with  cough dating back to childhood getting worse since 2013 with a neg w/u at [facility] pulmonary (nl pfts on record, no ENT eval) and no resp to qvar / prilosec so referred to pulmonary clinic 06/05/2015 by Dr [name] for persistent cough.   06/05/2015 1st [place] [facility] office visit/ [name]  Cough x 70 y / worse x 3  y  Chief Complaint  Patient presents with  . Pulmonary Consult    Ref Dr. Catha Lyons for recurrent cough. c/o prod cough with milky clear musuc. also wheezing,. denies any sob, cp/ tightness. Using CPAP machine sleeping 8-9 hrs nightly.   def connection to swallowing esp p supper p taking prilosec 20 taken p breakfast. rec  Stop tenomin and start bisprolol 5 mg twice daily instead Prednisone 10 mg take  4 each am x 2 days,   2 each am x 2 days,  1 each am x 2 days and stop  Omeprazole 40 mg Take 30- 60 min before your first and last meals of the day  GERD Please see patient coordinator before you leave today  to schedule diagnostic esophogram    06/24/2015  f/u ov/[name] re:  Chief Complaint  Patient presents with  . Follow-up    Pt states he was unable to take pred taper from last visit due to increased BS. He also could not take bisoprolol due to irregular pulse rate. He could not take the prilosec 40 mg bid due to diarrhea. His cough is unchanged.   did not take PPI as directed and changed every one of his responses on cough questionaire from prev ov:      [name] Reflux v Neurogenic Cough Differentiator Reflux Comments  Do you awaken from a sound sleep coughing violently?                            With trouble breathing? no   Do you have choking episodes when you cannot  Get enough air, gasping for air ?              No    Do you usually cough when you lie down into  The bed, or when you just lie down to rest ?                           X 10 secs then done for the night    Do you usually cough after meals or eating?         Yes every meal, worse at supper    Do you cough when (or after) you bend over?    no   GERD SCORE     [name] Reflux v Neurogenic Cough Differentiator Neurogenic   Do you more-or-less cough all day long? sporadic   Does change of temperature make you cough? Maybe worse in cold    Does laughing or chuckling cause you to cough? no   Do fumes (perfume, automobile fumes, burned  Toast, etc.,) cause you to cough ?      cig smoke   Does speaking, singing, or talking on the phone cause you to cough   ?  No    Neurogenic/Airway score                   Objective:   Physical Exam   amb wm nad seems angry over previous ov and did not complete the exam   Wt Readings from Last 3 Encounters:  06/24/15 234 lb (106.142 kg)  06/05/15 235 lb 6.4 oz (106.777 kg)  12/09/11 235 lb 10.8 oz (106.9 kg)    Vital signs reviewed           Assessment:

## 2015-06-24 NOTE — Patient Instructions (Signed)
Return to Dr Clarene Duke for referral to cough specialist of your choice - saw pulmonary at [facility] but not ENT / voice center

## 2015-06-24 NOTE — Assessment & Plan Note (Signed)
DgEsophagram 06/17/2015 >  1. Zenker's diverticulum. 2. Small hiatal hernia.   I had an extended final summary discussion with the patient reviewing all relevant studies completed to date and  lasting 15 to 20 minutes of a 25 minute visit on the following issues:    1) cough for 70 years may be difficult to erradicate but we don't have a chance to do so if not able to stay on recs long enough to give them a fair chance to work  2) gave him the option of following of instructions giving or calling if there's a problem he encounters with those instructions (as apparently happened here) or find another doctor to work with - I offered this to him since he seemed angry with me > he chose the latter.   Strongly re return to [facility]/ voice center (Dr Delford Field) rather than return to pulmonary.

## 2015-07-03 ENCOUNTER — Ambulatory Visit: Payer: Medicare Other | Admitting: Internal Medicine

## 2015-11-07 ENCOUNTER — Encounter: Payer: Self-pay | Admitting: Internal Medicine

## 2016-09-02 ENCOUNTER — Other Ambulatory Visit: Payer: Self-pay | Admitting: Orthopaedic Surgery

## 2016-09-02 DIAGNOSIS — M545 Low back pain: Secondary | ICD-10-CM

## 2016-09-07 ENCOUNTER — Ambulatory Visit
Admission: RE | Admit: 2016-09-07 | Discharge: 2016-09-07 | Disposition: A | Discharge status: Home / Self Care | Payer: Medicare Other | Source: Ambulatory Visit | Attending: Orthopaedic Surgery | Admitting: Orthopaedic Surgery

## 2016-09-07 DIAGNOSIS — M545 Low back pain: Secondary | ICD-10-CM

## 2016-09-09 ENCOUNTER — Other Ambulatory Visit: Payer: Self-pay | Admitting: Orthopaedic Surgery

## 2017-02-16 ENCOUNTER — Other Ambulatory Visit: Payer: Self-pay | Admitting: Dermatology

## 2017-10-07 ENCOUNTER — Other Ambulatory Visit: Payer: Self-pay

## 2019-08-28 ENCOUNTER — Other Ambulatory Visit: Payer: Self-pay | Admitting: Orthopaedic Surgery

## 2019-08-28 DIAGNOSIS — M47816 Spondylosis without myelopathy or radiculopathy, lumbar region: Secondary | ICD-10-CM

## 2019-08-29 ENCOUNTER — Telehealth: Payer: Self-pay

## 2019-08-29 NOTE — Telephone Encounter (Signed)
Patient returned our call from yesterday for me to review his medications and drug allergies before getting him scheduled for a lumbar myelogram.  I informed him he would be here two hours, would need a driver and would need to be on strict bedrest for 24 hours after the procedure.  At that point he said, "Hold up!"  He told me his back already is feeling better and that he didn't think he wanted to go through with this procedure, at least not without talking to Dr. Noel Gerold.  I told him I totally understood, especially if he was feeling better. In the event he does decide to go through with this myelogram, we would need to submit clearance to hold Eliquis for four doses with Dr. Luella Cook at [facility] (per patient).

## 2019-08-29 NOTE — Telephone Encounter (Signed)
Mr. Okelley just called back to let me know he has given some thought to having the lumbar myelogram and wishes to proceed, as he feels the issue with his back isn't going to go away.  I told him I would fax the thinner clearance to his cardiologist, and that our scheduler, Jenel Lucks, will call him as soon as the clearance comes back.

## 2019-08-30 ENCOUNTER — Other Ambulatory Visit: Payer: Self-pay | Admitting: Neurology

## 2019-09-05 ENCOUNTER — Other Ambulatory Visit: Payer: Self-pay

## 2019-09-05 ENCOUNTER — Ambulatory Visit
Admission: RE | Admit: 2019-09-05 | Discharge: 2019-09-05 | Disposition: A | Discharge status: Home / Self Care | Payer: Medicare PPO | Source: Ambulatory Visit | Attending: Orthopaedic Surgery | Admitting: Orthopaedic Surgery

## 2019-09-05 ENCOUNTER — Ambulatory Visit
Admission: RE | Admit: 2019-09-05 | Discharge: 2019-09-05 | Disposition: A | Discharge status: Home / Self Care | Payer: Medicare Other | Source: Ambulatory Visit | Attending: Orthopaedic Surgery | Admitting: Orthopaedic Surgery

## 2019-09-05 DIAGNOSIS — M47816 Spondylosis without myelopathy or radiculopathy, lumbar region: Secondary | ICD-10-CM

## 2019-09-05 MED ORDER — DIAZEPAM 5 MG PO TABS
5.0000 mg | ORAL_TABLET | Freq: Once | ORAL | Status: AC
  Administered 2019-09-05: 5 mg via ORAL

## 2019-09-05 MED ORDER — IOPAMIDOL (ISOVUE-M 200) INJECTION 41%
15.0000 mL | Freq: Once | INTRAMUSCULAR | Status: AC
  Administered 2019-09-05: 15 mL via INTRATHECAL

## 2019-09-05 NOTE — Discharge Instructions (Signed)

## 2019-10-24 DIAGNOSIS — M5416 Radiculopathy, lumbar region: Secondary | ICD-10-CM | POA: Diagnosis not present

## 2019-10-24 DIAGNOSIS — M5136 Other intervertebral disc degeneration, lumbar region: Secondary | ICD-10-CM | POA: Diagnosis not present

## 2019-11-27 DIAGNOSIS — I480 Paroxysmal atrial fibrillation: Secondary | ICD-10-CM | POA: Diagnosis not present

## 2019-11-27 DIAGNOSIS — Z4509 Encounter for adjustment and management of other cardiac device: Secondary | ICD-10-CM | POA: Diagnosis not present

## 2019-11-27 DIAGNOSIS — Z950 Presence of cardiac pacemaker: Secondary | ICD-10-CM | POA: Diagnosis not present

## 2019-11-27 DIAGNOSIS — Z45010 Encounter for checking and testing of cardiac pacemaker pulse generator [battery]: Secondary | ICD-10-CM | POA: Diagnosis not present

## 2019-11-27 DIAGNOSIS — M5136 Other intervertebral disc degeneration, lumbar region: Secondary | ICD-10-CM | POA: Diagnosis not present

## 2019-12-11 DIAGNOSIS — M5416 Radiculopathy, lumbar region: Secondary | ICD-10-CM | POA: Diagnosis not present

## 2019-12-11 DIAGNOSIS — M5136 Other intervertebral disc degeneration, lumbar region: Secondary | ICD-10-CM | POA: Diagnosis not present

## 2019-12-24 DIAGNOSIS — L905 Scar conditions and fibrosis of skin: Secondary | ICD-10-CM | POA: Diagnosis not present

## 2019-12-24 DIAGNOSIS — L814 Other melanin hyperpigmentation: Secondary | ICD-10-CM | POA: Diagnosis not present

## 2019-12-24 DIAGNOSIS — L819 Disorder of pigmentation, unspecified: Secondary | ICD-10-CM | POA: Diagnosis not present

## 2019-12-24 DIAGNOSIS — L570 Actinic keratosis: Secondary | ICD-10-CM | POA: Diagnosis not present

## 2019-12-24 DIAGNOSIS — D229 Melanocytic nevi, unspecified: Secondary | ICD-10-CM | POA: Diagnosis not present

## 2019-12-24 DIAGNOSIS — L821 Other seborrheic keratosis: Secondary | ICD-10-CM | POA: Diagnosis not present

## 2019-12-24 DIAGNOSIS — Z85828 Personal history of other malignant neoplasm of skin: Secondary | ICD-10-CM | POA: Diagnosis not present

## 2019-12-24 DIAGNOSIS — D1801 Hemangioma of skin and subcutaneous tissue: Secondary | ICD-10-CM | POA: Diagnosis not present

## 2020-01-30 DIAGNOSIS — H353132 Nonexudative age-related macular degeneration, bilateral, intermediate dry stage: Secondary | ICD-10-CM | POA: Diagnosis not present

## 2020-01-30 DIAGNOSIS — E119 Type 2 diabetes mellitus without complications: Secondary | ICD-10-CM | POA: Diagnosis not present

## 2020-02-03 DIAGNOSIS — Z8639 Personal history of other endocrine, nutritional and metabolic disease: Secondary | ICD-10-CM | POA: Diagnosis not present

## 2020-02-03 DIAGNOSIS — R079 Chest pain, unspecified: Secondary | ICD-10-CM | POA: Diagnosis not present

## 2020-02-03 DIAGNOSIS — I480 Paroxysmal atrial fibrillation: Secondary | ICD-10-CM | POA: Diagnosis not present

## 2020-02-03 DIAGNOSIS — I484 Atypical atrial flutter: Secondary | ICD-10-CM | POA: Diagnosis not present

## 2020-02-03 DIAGNOSIS — R000 Tachycardia, unspecified: Secondary | ICD-10-CM | POA: Diagnosis not present

## 2020-02-03 DIAGNOSIS — Z9989 Dependence on other enabling machines and devices: Secondary | ICD-10-CM | POA: Diagnosis not present

## 2020-02-03 DIAGNOSIS — I440 Atrioventricular block, first degree: Secondary | ICD-10-CM | POA: Diagnosis not present

## 2020-02-03 DIAGNOSIS — I2510 Atherosclerotic heart disease of native coronary artery without angina pectoris: Secondary | ICD-10-CM | POA: Diagnosis not present

## 2020-02-03 DIAGNOSIS — K219 Gastro-esophageal reflux disease without esophagitis: Secondary | ICD-10-CM | POA: Diagnosis not present

## 2020-02-03 DIAGNOSIS — R002 Palpitations: Secondary | ICD-10-CM | POA: Diagnosis not present

## 2020-02-03 DIAGNOSIS — R0789 Other chest pain: Secondary | ICD-10-CM | POA: Diagnosis not present

## 2020-02-03 DIAGNOSIS — I495 Sick sinus syndrome: Secondary | ICD-10-CM | POA: Diagnosis not present

## 2020-02-03 DIAGNOSIS — Z955 Presence of coronary angioplasty implant and graft: Secondary | ICD-10-CM | POA: Diagnosis not present

## 2020-02-03 DIAGNOSIS — Z7901 Long term (current) use of anticoagulants: Secondary | ICD-10-CM | POA: Diagnosis not present

## 2020-02-03 DIAGNOSIS — G4733 Obstructive sleep apnea (adult) (pediatric): Secondary | ICD-10-CM | POA: Diagnosis not present

## 2020-02-03 DIAGNOSIS — I10 Essential (primary) hypertension: Secondary | ICD-10-CM | POA: Diagnosis not present

## 2020-02-03 DIAGNOSIS — Z950 Presence of cardiac pacemaker: Secondary | ICD-10-CM | POA: Diagnosis not present

## 2020-02-03 DIAGNOSIS — Z8679 Personal history of other diseases of the circulatory system: Secondary | ICD-10-CM | POA: Diagnosis not present

## 2020-02-03 DIAGNOSIS — M79602 Pain in left arm: Secondary | ICD-10-CM | POA: Diagnosis not present

## 2020-02-04 DIAGNOSIS — I480 Paroxysmal atrial fibrillation: Secondary | ICD-10-CM | POA: Diagnosis not present

## 2020-02-04 DIAGNOSIS — I440 Atrioventricular block, first degree: Secondary | ICD-10-CM | POA: Diagnosis not present

## 2020-02-04 DIAGNOSIS — I495 Sick sinus syndrome: Secondary | ICD-10-CM | POA: Diagnosis not present

## 2020-02-04 DIAGNOSIS — Z950 Presence of cardiac pacemaker: Secondary | ICD-10-CM | POA: Diagnosis not present

## 2020-02-04 DIAGNOSIS — Z9861 Coronary angioplasty status: Secondary | ICD-10-CM | POA: Diagnosis not present

## 2020-02-04 DIAGNOSIS — I484 Atypical atrial flutter: Secondary | ICD-10-CM | POA: Diagnosis not present

## 2020-02-04 DIAGNOSIS — I471 Supraventricular tachycardia: Secondary | ICD-10-CM | POA: Diagnosis not present

## 2020-02-04 DIAGNOSIS — R9431 Abnormal electrocardiogram [ECG] [EKG]: Secondary | ICD-10-CM | POA: Diagnosis not present

## 2020-02-04 DIAGNOSIS — R000 Tachycardia, unspecified: Secondary | ICD-10-CM | POA: Diagnosis not present

## 2020-02-04 DIAGNOSIS — I2510 Atherosclerotic heart disease of native coronary artery without angina pectoris: Secondary | ICD-10-CM | POA: Diagnosis not present

## 2020-02-04 DIAGNOSIS — Z9889 Other specified postprocedural states: Secondary | ICD-10-CM | POA: Diagnosis not present

## 2020-02-04 DIAGNOSIS — E119 Type 2 diabetes mellitus without complications: Secondary | ICD-10-CM | POA: Diagnosis not present

## 2020-02-04 DIAGNOSIS — I4892 Unspecified atrial flutter: Secondary | ICD-10-CM | POA: Diagnosis not present

## 2020-02-26 DIAGNOSIS — Z45018 Encounter for adjustment and management of other part of cardiac pacemaker: Secondary | ICD-10-CM | POA: Diagnosis not present

## 2020-02-26 DIAGNOSIS — I480 Paroxysmal atrial fibrillation: Secondary | ICD-10-CM | POA: Diagnosis not present

## 2020-02-27 DIAGNOSIS — H26491 Other secondary cataract, right eye: Secondary | ICD-10-CM | POA: Diagnosis not present

## 2020-03-03 DIAGNOSIS — H26491 Other secondary cataract, right eye: Secondary | ICD-10-CM | POA: Diagnosis not present

## 2020-03-03 DIAGNOSIS — H26493 Other secondary cataract, bilateral: Secondary | ICD-10-CM | POA: Diagnosis not present

## 2020-03-12 DIAGNOSIS — Z955 Presence of coronary angioplasty implant and graft: Secondary | ICD-10-CM | POA: Diagnosis not present

## 2020-03-12 DIAGNOSIS — I2510 Atherosclerotic heart disease of native coronary artery without angina pectoris: Secondary | ICD-10-CM | POA: Diagnosis not present

## 2020-03-12 DIAGNOSIS — I480 Paroxysmal atrial fibrillation: Secondary | ICD-10-CM | POA: Diagnosis not present

## 2020-03-12 DIAGNOSIS — Z888 Allergy status to other drugs, medicaments and biological substances status: Secondary | ICD-10-CM | POA: Diagnosis not present

## 2020-03-31 DIAGNOSIS — Z961 Presence of intraocular lens: Secondary | ICD-10-CM | POA: Diagnosis not present

## 2020-03-31 DIAGNOSIS — H26492 Other secondary cataract, left eye: Secondary | ICD-10-CM | POA: Diagnosis not present

## 2020-04-02 DIAGNOSIS — I10 Essential (primary) hypertension: Secondary | ICD-10-CM | POA: Diagnosis not present

## 2020-04-02 DIAGNOSIS — Z955 Presence of coronary angioplasty implant and graft: Secondary | ICD-10-CM | POA: Diagnosis not present

## 2020-04-02 DIAGNOSIS — H26492 Other secondary cataract, left eye: Secondary | ICD-10-CM | POA: Diagnosis not present

## 2020-04-02 DIAGNOSIS — I495 Sick sinus syndrome: Secondary | ICD-10-CM | POA: Diagnosis not present

## 2020-04-02 DIAGNOSIS — G4733 Obstructive sleep apnea (adult) (pediatric): Secondary | ICD-10-CM | POA: Diagnosis not present

## 2020-04-02 DIAGNOSIS — Z950 Presence of cardiac pacemaker: Secondary | ICD-10-CM | POA: Diagnosis not present

## 2020-04-02 DIAGNOSIS — I4891 Unspecified atrial fibrillation: Secondary | ICD-10-CM | POA: Diagnosis not present

## 2020-04-02 DIAGNOSIS — E785 Hyperlipidemia, unspecified: Secondary | ICD-10-CM | POA: Diagnosis not present

## 2020-04-02 DIAGNOSIS — Z9989 Dependence on other enabling machines and devices: Secondary | ICD-10-CM | POA: Diagnosis not present

## 2020-04-02 DIAGNOSIS — I2510 Atherosclerotic heart disease of native coronary artery without angina pectoris: Secondary | ICD-10-CM | POA: Diagnosis not present

## 2020-05-26 DIAGNOSIS — N401 Enlarged prostate with lower urinary tract symptoms: Secondary | ICD-10-CM | POA: Diagnosis not present

## 2020-05-26 DIAGNOSIS — R351 Nocturia: Secondary | ICD-10-CM | POA: Diagnosis not present

## 2020-05-26 DIAGNOSIS — N476 Balanoposthitis: Secondary | ICD-10-CM | POA: Diagnosis not present

## 2020-05-27 DIAGNOSIS — I480 Paroxysmal atrial fibrillation: Secondary | ICD-10-CM | POA: Diagnosis not present

## 2020-05-27 DIAGNOSIS — Z45018 Encounter for adjustment and management of other part of cardiac pacemaker: Secondary | ICD-10-CM | POA: Diagnosis not present

## 2020-06-25 DIAGNOSIS — Z85828 Personal history of other malignant neoplasm of skin: Secondary | ICD-10-CM | POA: Diagnosis not present

## 2020-06-25 DIAGNOSIS — L821 Other seborrheic keratosis: Secondary | ICD-10-CM | POA: Diagnosis not present

## 2020-06-25 DIAGNOSIS — L570 Actinic keratosis: Secondary | ICD-10-CM | POA: Diagnosis not present

## 2020-06-25 DIAGNOSIS — D229 Melanocytic nevi, unspecified: Secondary | ICD-10-CM | POA: Diagnosis not present

## 2020-06-25 DIAGNOSIS — L905 Scar conditions and fibrosis of skin: Secondary | ICD-10-CM | POA: Diagnosis not present

## 2020-06-25 DIAGNOSIS — D1801 Hemangioma of skin and subcutaneous tissue: Secondary | ICD-10-CM | POA: Diagnosis not present

## 2020-06-25 DIAGNOSIS — L814 Other melanin hyperpigmentation: Secondary | ICD-10-CM | POA: Diagnosis not present

## 2020-09-04 ENCOUNTER — Other Ambulatory Visit: Payer: Self-pay | Admitting: Orthopedic Surgery

## 2020-10-06 ENCOUNTER — Other Ambulatory Visit: Payer: Self-pay

## 2020-10-06 ENCOUNTER — Encounter (HOSPITAL_BASED_OUTPATIENT_CLINIC_OR_DEPARTMENT_OTHER): Payer: Self-pay | Admitting: Orthopedic Surgery

## 2020-10-08 ENCOUNTER — Other Ambulatory Visit: Payer: Self-pay

## 2020-10-08 ENCOUNTER — Encounter (HOSPITAL_BASED_OUTPATIENT_CLINIC_OR_DEPARTMENT_OTHER)
Admission: RE | Admit: 2020-10-08 | Discharge: 2020-10-08 | Disposition: A | Discharge status: Home / Self Care | Payer: Medicare PPO | Source: Ambulatory Visit | Attending: Orthopedic Surgery | Admitting: Orthopedic Surgery

## 2020-10-08 DIAGNOSIS — Z01812 Encounter for preprocedural laboratory examination: Secondary | ICD-10-CM | POA: Insufficient documentation

## 2020-10-08 LAB — BASIC METABOLIC PANEL
Anion gap: 6 (ref 5–15)
BUN: 19 mg/dL (ref 8–23)
CO2: 28 mmol/L (ref 22–32)
Calcium: 9.3 mg/dL (ref 8.9–10.3)
Chloride: 105 mmol/L (ref 98–111)
Creatinine, Ser: 1.02 mg/dL (ref 0.61–1.24)
GFR, Estimated: >60 mL/min (ref >60)
Glucose, Bld: 209 mg/dL — ABNORMAL HIGH (ref 70–99)
Potassium: 4.5 mmol/L (ref 3.5–5.1)
Sodium: 139 mmol/L (ref 135–145)

## 2020-10-08 NOTE — Progress Notes (Signed)

## 2020-10-13 ENCOUNTER — Ambulatory Visit (HOSPITAL_BASED_OUTPATIENT_CLINIC_OR_DEPARTMENT_OTHER)
Admission: RE | Admit: 2020-10-13 | Discharge: 2020-10-13 | Disposition: A | Discharge status: Home / Self Care | Payer: Medicare PPO | Attending: Orthopedic Surgery | Admitting: Orthopedic Surgery

## 2020-10-13 ENCOUNTER — Ambulatory Visit (HOSPITAL_BASED_OUTPATIENT_CLINIC_OR_DEPARTMENT_OTHER): Payer: Medicare PPO | Admitting: Anesthesiology

## 2020-10-13 ENCOUNTER — Other Ambulatory Visit: Payer: Self-pay

## 2020-10-13 ENCOUNTER — Encounter (HOSPITAL_BASED_OUTPATIENT_CLINIC_OR_DEPARTMENT_OTHER)
Admission: RE | Disposition: A | Discharge status: Home / Self Care | Payer: Self-pay | Source: Home / Self Care | Attending: Orthopedic Surgery

## 2020-10-13 ENCOUNTER — Encounter (HOSPITAL_BASED_OUTPATIENT_CLINIC_OR_DEPARTMENT_OTHER): Payer: Self-pay | Admitting: Orthopedic Surgery

## 2020-10-13 DIAGNOSIS — Z79899 Other long term (current) drug therapy: Secondary | ICD-10-CM | POA: Insufficient documentation

## 2020-10-13 DIAGNOSIS — Z7984 Long term (current) use of oral hypoglycemic drugs: Secondary | ICD-10-CM | POA: Diagnosis not present

## 2020-10-13 DIAGNOSIS — Z7901 Long term (current) use of anticoagulants: Secondary | ICD-10-CM | POA: Insufficient documentation

## 2020-10-13 DIAGNOSIS — Z7982 Long term (current) use of aspirin: Secondary | ICD-10-CM | POA: Insufficient documentation

## 2020-10-13 DIAGNOSIS — E119 Type 2 diabetes mellitus without complications: Secondary | ICD-10-CM | POA: Insufficient documentation

## 2020-10-13 DIAGNOSIS — Z950 Presence of cardiac pacemaker: Secondary | ICD-10-CM | POA: Insufficient documentation

## 2020-10-13 DIAGNOSIS — Z887 Allergy status to serum and vaccine status: Secondary | ICD-10-CM | POA: Insufficient documentation

## 2020-10-13 DIAGNOSIS — Z888 Allergy status to other drugs, medicaments and biological substances status: Secondary | ICD-10-CM | POA: Insufficient documentation

## 2020-10-13 DIAGNOSIS — Z955 Presence of coronary angioplasty implant and graft: Secondary | ICD-10-CM | POA: Insufficient documentation

## 2020-10-13 DIAGNOSIS — K219 Gastro-esophageal reflux disease without esophagitis: Secondary | ICD-10-CM | POA: Insufficient documentation

## 2020-10-13 DIAGNOSIS — M67442 Ganglion, left hand: Secondary | ICD-10-CM | POA: Diagnosis not present

## 2020-10-13 DIAGNOSIS — I480 Paroxysmal atrial fibrillation: Secondary | ICD-10-CM | POA: Insufficient documentation

## 2020-10-13 DIAGNOSIS — M25742 Osteophyte, left hand: Secondary | ICD-10-CM | POA: Insufficient documentation

## 2020-10-13 DIAGNOSIS — L728 Other follicular cysts of the skin and subcutaneous tissue: Secondary | ICD-10-CM | POA: Diagnosis present

## 2020-10-13 HISTORY — DX: Presence of cardiac pacemaker: Z95.0

## 2020-10-13 HISTORY — PX: CYST EXCISION: SHX5701

## 2020-10-13 LAB — GLUCOSE, CAPILLARY
Glucose-Capillary: 137 mg/dL — ABNORMAL HIGH (ref 70–99)
Glucose-Capillary: 125 mg/dL — ABNORMAL HIGH (ref 70–99)

## 2020-10-13 SURGERY — CYST REMOVAL
Anesthesia: Monitor Anesthesia Care | Site: Finger | Laterality: Left

## 2020-10-13 MED ORDER — FENTANYL CITRATE (PF) 100 MCG/2ML IJ SOLN: 25.0000 ug | INTRAMUSCULAR | Status: DC | PRN

## 2020-10-13 MED ORDER — CEFAZOLIN SODIUM-DEXTROSE 2-4 GM/100ML-% IV SOLN
2.0000 g | INTRAVENOUS | Status: AC
  Administered 2020-10-13: 2 g via INTRAVENOUS

## 2020-10-13 MED ORDER — CEFAZOLIN SODIUM-DEXTROSE 2-4 GM/100ML-% IV SOLN
INTRAVENOUS | Status: AC
  Filled 2020-10-13: qty 100

## 2020-10-13 MED ORDER — ALCOHOL 98 % IJ SOLN
INTRAMUSCULAR | Status: AC
  Filled 2020-10-13: qty 10

## 2020-10-13 MED ORDER — OXYCODONE HCL 5 MG PO TABS: 5.0000 mg | ORAL_TABLET | Freq: Once | ORAL | Status: DC | PRN

## 2020-10-13 MED ORDER — ONDANSETRON HCL 4 MG/2ML IJ SOLN
INTRAMUSCULAR | Status: DC | PRN
  Administered 2020-10-13: 4 mg via INTRAVENOUS

## 2020-10-13 MED ORDER — FENTANYL CITRATE (PF) 100 MCG/2ML IJ SOLN
INTRAMUSCULAR | Status: AC
  Filled 2020-10-13: qty 2

## 2020-10-13 MED ORDER — OXYCODONE HCL 5 MG/5ML PO SOLN: 5.0000 mg | Freq: Once | ORAL | Status: DC | PRN

## 2020-10-13 MED ORDER — 0.9 % SODIUM CHLORIDE (POUR BTL) OPTIME
TOPICAL | Status: DC | PRN
  Administered 2020-10-13: 120 mL

## 2020-10-13 MED ORDER — PROPOFOL 500 MG/50ML IV EMUL
INTRAVENOUS | Status: DC | PRN
  Administered 2020-10-13: 75 ug/kg/min via INTRAVENOUS

## 2020-10-13 MED ORDER — ONDANSETRON HCL 4 MG/2ML IJ SOLN: 4.0000 mg | Freq: Once | INTRAMUSCULAR | Status: DC | PRN

## 2020-10-13 MED ORDER — BUPIVACAINE HCL (PF) 0.25 % IJ SOLN
INTRAMUSCULAR | Status: DC | PRN
  Administered 2020-10-13: 10 mL

## 2020-10-13 MED ORDER — BUPIVACAINE HCL (PF) 0.25 % IJ SOLN
INTRAMUSCULAR | Status: AC
  Filled 2020-10-13: qty 30

## 2020-10-13 MED ORDER — FENTANYL CITRATE (PF) 100 MCG/2ML IJ SOLN
INTRAMUSCULAR | Status: DC | PRN
  Administered 2020-10-13: 50 ug via INTRAVENOUS

## 2020-10-13 MED ORDER — LACTATED RINGERS IV SOLN: INTRAVENOUS | Status: DC

## 2020-10-13 MED ORDER — LIDOCAINE HCL (PF) 0.5 % IJ SOLN
INTRAMUSCULAR | Status: DC | PRN
  Administered 2020-10-13: 30 mL via INTRAVENOUS

## 2020-10-13 MED ORDER — AMISULPRIDE (ANTIEMETIC) 5 MG/2ML IV SOLN: 10.0000 mg | Freq: Once | INTRAVENOUS | Status: DC | PRN

## 2020-10-13 MED ORDER — PROPOFOL 500 MG/50ML IV EMUL
INTRAVENOUS | Status: AC
  Filled 2020-10-13: qty 50

## 2020-10-13 MED ORDER — HYDROCODONE-ACETAMINOPHEN 5-325 MG PO TABS: ORAL_TABLET | ORAL | 0 refills | Status: DC

## 2020-10-13 MED ORDER — PROPOFOL 500 MG/50ML IV EMUL
INTRAVENOUS | Status: DC | PRN
  Administered 2020-10-13: 20 mg via INTRAVENOUS

## 2020-10-13 SURGICAL SUPPLY — 53 items
APL PRP STRL LF DISP 70% ISPRP (MISCELLANEOUS) ×1
APL SKNCLS STERI-STRIP NONHPOA (GAUZE/BANDAGES/DRESSINGS)
BENZOIN TINCTURE PRP APPL 2/3 (GAUZE/BANDAGES/DRESSINGS) IMPLANT
BLADE MINI RND TIP GREEN BEAV (BLADE) IMPLANT
BLADE SURG 15 STRL LF DISP TIS (BLADE) ×2 IMPLANT
BLADE SURG 15 STRL SS (BLADE) ×4
BNDG CMPR 9X4 STRL LF SNTH (GAUZE/BANDAGES/DRESSINGS) ×1
BNDG COHESIVE 1X5 TAN STRL LF (GAUZE/BANDAGES/DRESSINGS) IMPLANT
BNDG COHESIVE 2X5 TAN STRL LF (GAUZE/BANDAGES/DRESSINGS) IMPLANT
BNDG CONFORM 2 STRL LF (GAUZE/BANDAGES/DRESSINGS) IMPLANT
BNDG ELASTIC 2X5.8 VLCR STR LF (GAUZE/BANDAGES/DRESSINGS) IMPLANT
BNDG ELASTIC 3X5.8 VLCR STR LF (GAUZE/BANDAGES/DRESSINGS) IMPLANT
BNDG ESMARK 4X9 LF (GAUZE/BANDAGES/DRESSINGS) ×1 IMPLANT
BNDG GAUZE 1X2.1 STRL (MISCELLANEOUS) IMPLANT
BNDG GAUZE ELAST 4 BULKY (GAUZE/BANDAGES/DRESSINGS) IMPLANT
BNDG PLASTER X FAST 3X3 WHT LF (CAST SUPPLIES) IMPLANT
BNDG PLSTR 9X3 FST ST WHT (CAST SUPPLIES)
CHLORAPREP W/TINT 26 (MISCELLANEOUS) ×2 IMPLANT
CORD BIPOLAR FORCEPS 12FT (ELECTRODE) ×2 IMPLANT
COVER BACK TABLE 60X90IN (DRAPES) ×2 IMPLANT
COVER MAYO STAND STRL (DRAPES) ×2 IMPLANT
COVER WAND RF STERILE (DRAPES) IMPLANT
CUFF TOURN SGL QUICK 18X4 (TOURNIQUET CUFF) ×2 IMPLANT
DRAPE EXTREMITY T 121X128X90 (DISPOSABLE) ×2 IMPLANT
DRAPE SURG 17X23 STRL (DRAPES) ×2 IMPLANT
GAUZE SPONGE 4X4 12PLY STRL (GAUZE/BANDAGES/DRESSINGS) ×2 IMPLANT
GAUZE XEROFORM 1X8 LF (GAUZE/BANDAGES/DRESSINGS) ×2 IMPLANT
GLOVE SRG 8 PF TXTR STRL LF DI (GLOVE) ×1 IMPLANT
GLOVE SURG ENC MOIS LTX SZ7.5 (GLOVE) ×2 IMPLANT
GLOVE SURG POLYISO LF SZ7 (GLOVE) ×1 IMPLANT
GLOVE SURG UNDER POLY LF SZ7 (GLOVE) ×2 IMPLANT
GLOVE SURG UNDER POLY LF SZ8 (GLOVE) ×2
GOWN STRL REUS W/ TWL LRG LVL3 (GOWN DISPOSABLE) ×1 IMPLANT
GOWN STRL REUS W/TWL LRG LVL3 (GOWN DISPOSABLE) ×2
NDL HYPO 25X1 1.5 SAFETY (NEEDLE) ×1 IMPLANT
NEEDLE HYPO 25X1 1.5 SAFETY (NEEDLE) ×2 IMPLANT
NS IRRIG 1000ML POUR BTL (IV SOLUTION) ×2 IMPLANT
PACK BASIN DAY SURGERY FS (CUSTOM PROCEDURE TRAY) ×2 IMPLANT
PAD CAST 3X4 CTTN HI CHSV (CAST SUPPLIES) IMPLANT
PAD CAST 4YDX4 CTTN HI CHSV (CAST SUPPLIES) IMPLANT
PADDING CAST ABS 4INX4YD NS (CAST SUPPLIES) ×1
PADDING CAST ABS COTTON 4X4 ST (CAST SUPPLIES) ×1 IMPLANT
PADDING CAST COTTON 3X4 STRL (CAST SUPPLIES)
PADDING CAST COTTON 4X4 STRL (CAST SUPPLIES)
SPLINT FINGER 3.25 911903 (SOFTGOODS) ×1 IMPLANT
STOCKINETTE 4X48 STRL (DRAPES) ×2 IMPLANT
STRIP CLOSURE SKIN 1/2X4 (GAUZE/BANDAGES/DRESSINGS) IMPLANT
SUT ETHILON 3 0 PS 1 (SUTURE) IMPLANT
SUT ETHILON 4 0 PS 2 18 (SUTURE) ×2 IMPLANT
SYR BULB EAR ULCER 3OZ GRN STR (SYRINGE) ×2 IMPLANT
SYR CONTROL 10ML LL (SYRINGE) ×2 IMPLANT
TOWEL GREEN STERILE FF (TOWEL DISPOSABLE) ×4 IMPLANT
UNDERPAD 30X36 HEAVY ABSORB (UNDERPADS AND DIAPERS) ×2 IMPLANT

## 2020-10-13 NOTE — Transfer of Care (Signed)
Immediate Anesthesia Transfer of Care Note  Patient: Kenyata Guess Rios  Procedure(s) Performed: LEFT INDEX FINGER EXCISION  MUCOID CYST AND DEBRIDEMENT DISTAL INTERPHALNGEAL JOINT AND POSSIBLE ROTATION FLAP (Left Finger)  Patient Location: PACU  Anesthesia Type:MAC and Bier block  Level of Consciousness: drowsy, patient cooperative and responds to stimulation  Airway & Oxygen Therapy: Patient Spontanous Breathing and Patient connected to face mask oxygen  Post-op Assessment: Report given to RN and Post -op Vital signs reviewed and stable  Post vital signs: Reviewed and stable  Last Vitals:  Vitals Value Taken Time  BP    Temp    Pulse 63 10/13/20 1450  Resp 16 10/13/20 1450  SpO2 99 % 10/13/20 1450  Vitals shown include unvalidated device data.  Last Pain:  Vitals:   10/13/20 1150  TempSrc: Oral  PainSc: 0-No pain      Patients Stated Pain Goal: 3 (22/57/50 5183)  Complications: No complications documented.

## 2020-10-13 NOTE — H&P (Signed)
[name] is an 80 y.o. male.   Chief Complaint: finger mass HPI: 80 year old right-hand-dominant male. He has noticed a issue with his left index finger nail for 4 months.  It is bothersome to him.  Nail deformity.  He wishes to have the mass excised and the joint debrided to try to prevent recurrence.  Allergies:  Allergies  Allergen Reactions  . Brilinta [Ticagrelor] Anaphylaxis  . Dopamine Palpitations and Other (See Comments)    "years ago; shook 15 minutes after they stopped giving it to me"  . Tetanus Toxoids Swelling    "Oraserum Tetanus"  . Warfarin Sodium Other (See Comments)    Hematuria   . Fish Oil Other (See Comments)    Hematuria     Past Medical History:  Diagnosis Date  . CAD (coronary artery disease), native coronary artery 06/2000   PCI to Proximal LAD 3.0 mm x 15 mm BMS  . Diabetes mellitus type 2, noninsulin dependent (HCC)    complicated by CAD  . Dyslipidemia (high LDL; low HDL)   . Enlarged prostate   . GERD (gastroesophageal reflux disease)   . H/O hiatal hernia   . History of bronchitis    "3 or 4 episodes over my lifetime" (12/08/11)  . History of hematuria    no longer on warfarin  . Hypertension, uncontrolled 06/2000  . Migraines    "used to; not anymore"  . OSA on CPAP    uses cpap  . PAF (paroxysmal atrial fibrillation) (HCC)    s/p RF ablation; not on warfartin  . Presence of bare metal stent in LAD coronary artery 06/2000   3.0 mm x 15 mm BMS  . Presence of permanent cardiac pacemaker   . S/P angioplasty with stent, placement of 2 overlapping DES Xience stents, to prox. Mid Lad and PTCA of distal LAD.  12/08/11 12/09/2011    Past Surgical History:  Procedure Laterality Date  . APPENDECTOMY  ~ 1976  . BACK SURGERY    . CARDIAC ELECTROPHYSIOLOGY STUDY AND ABLATION  ~ 2008  . CATARACT EXTRACTION W/ INTRAOCULAR LENS  IMPLANT, BILATERAL  2012  . CHOLECYSTECTOMY  ~ 1976  . CORONARY ANGIOPLASTY WITH STENT PLACEMENT  06/2000   "1"  .  CORONARY ANGIOPLASTY WITH STENT PLACEMENT  12/08/11   "2 today; total of 3 now"  . LEFT HEART CATHETERIZATION WITH CORONARY ANGIOGRAM N/A 12/08/2011   Procedure: LEFT HEART CATHETERIZATION WITH CORONARY ANGIOGRAM;  Surgeon: Marykay Lex, MD;  Location: [facility] CATH LAB;  Service: Cardiovascular;  Laterality: N/A;  . LUMBAR DISC SURGERY  1996   "partial removal"  . PERCUTANEOUS CORONARY STENT INTERVENTION (PCI-S)  12/08/2011   Procedure: PERCUTANEOUS CORONARY STENT INTERVENTION (PCI-S);  Surgeon: Marykay Lex, MD;  Location: [facility] CATH LAB;  Service: Cardiovascular;;  . PROSTATE SURGERY  2005-2008   "several exploratory OR's to determine why I was bleeding; 3 or 4"  . RENAL ANGIOGRAM N/A 12/08/2011   Procedure: RENAL ANGIOGRAM;  Surgeon: Marykay Lex, MD;  Location: [facility] CATH LAB;  Service: Cardiovascular;  Laterality: N/A;  . TONSILLECTOMY AND ADENOIDECTOMY  1952    Family History: History reviewed. No pertinent family history.  Social History:   reports that he has never smoked. He has never used smokeless tobacco. He reports that he does not drink alcohol and does not use drugs.  Medications: Medications Prior to Admission  Medication Sig Dispense Refill  . alfuzosin (UROXATRAL) 10 MG 24 hr tablet TAKE 1 TABLET BY MOUTH  AT BEDTIME    . apixaban (ELIQUIS) 5 MG TABS tablet Take 5 mg by mouth 2 (two) times daily.    Marland Kitchen aspirin EC 81 MG tablet Take 81 mg by mouth daily.    Marland Kitchen atenolol (TENORMIN) 50 MG tablet Take 1 tablet by mouth 2 (two) times daily.    . Coenzyme Q10 (CO Q-10) 200 MG CAPS Take 1 tablet by mouth daily.    . diphenhydramine-acetaminophen (TYLENOL PM) 25-500 MG TABS Take 1 tablet by mouth at bedtime.    . empagliflozin (JARDIANCE) 25 MG TABS tablet TAKE 1 TABLET BY MOUTH EVERY DAY    . glipiZIDE (GLUCOTROL) 10 MG tablet Take 10 mg by mouth 2 (two) times daily before a meal.    . metFORMIN (GLUCOPHAGE) 500 MG tablet Take 1,000 mg by mouth 2 (two) times daily with a meal. Was on  hold due to procedure    . omeprazole (PRILOSEC) 20 MG capsule Take 20 mg by mouth 2 (two) times daily before a meal.    . rosuvastatin (CRESTOR) 20 MG tablet Take 20 mg by mouth daily.    . sitaGLIPtin (JANUVIA) 100 MG tablet Take 100 mg by mouth daily.    Marland Kitchen telmisartan (MICARDIS) 80 MG tablet Take 40 mg by mouth daily.    . beta carotene w/minerals (OCUVITE) tablet Take 1 tablet by mouth daily.    Marland Kitchen gabapentin (NEURONTIN) 100 MG capsule Take 100 mg by mouth at bedtime.      Results for orders placed or performed during the hospital encounter of 10/13/20 (from the past 48 hour(s))  Glucose, capillary     Status: Abnormal   Collection Time: 10/13/20 11:58 AM  Result Value Ref Range   Glucose-Capillary 137 (H) 70 - 99 mg/dL    Comment: Glucose reference range applies only to samples taken after fasting for at least 8 hours.    No results found.   A comprehensive review of systems was negative.  Blood pressure (!) 161/70, pulse 60, temperature 98.1 F (36.7 C), temperature source Oral, resp. rate 16, height 5\' 11"  (1.803 m), weight 99 kg, SpO2 99 %.  General appearance: alert, cooperative and appears stated age Head: Normocephalic, without obvious abnormality, atraumatic Neck: supple, symmetrical, trachea midline Cardio: regular rate and rhythm Resp: clear to auscultation bilaterally Extremities: Intact sensation and capillary refill all digits.  +epl/fpl/io.  No wounds. Pulses: 2+ and symmetric Skin: Skin color, texture, turgor normal. No rashes or lesions Neurologic: Grossly normal Incision/Wound: none  Assessment/Plan Left index finger mucoid cyst.  Plan excision with debridement dip joint and possible rotation flap as necessary.  Non operative and operative treatment options have been discussed with the patient and patient wishes to proceed with operative treatment. Risks, benefits, and alternatives of surgery have been discussed and the patient agrees with the plan of care.    10/13/2020, 1:53 PM

## 2020-10-13 NOTE — Discharge Instructions (Addendum)

## 2020-10-13 NOTE — Anesthesia Preprocedure Evaluation (Addendum)
Anesthesia Evaluation  Patient identified by MRN, date of birth, ID band Patient awake    Reviewed: Allergy & Precautions, NPO status , Patient's Chart, lab work & pertinent test results, reviewed documented beta blocker date and time   History of Anesthesia Complications Negative for: history of anesthetic complications  Airway Mallampati: I  TM Distance: >3 FB Neck ROM: Full    Dental  (+) Teeth Intact   Pulmonary sleep apnea and Continuous Positive Airway Pressure Ventilation ,    Pulmonary exam normal        Cardiovascular hypertension, Pt. on medications and Pt. on home beta blockers + CAD and + Cardiac Stents  Normal cardiovascular exam+ dysrhythmias Atrial Fibrillation + pacemaker      Neuro/Psych negative neurological ROS     GI/Hepatic Neg liver ROS, hiatal hernia, GERD  Medicated,  Endo/Other  diabetes, Type 2, Oral Hypoglycemic Agents  Renal/GU negative Renal ROS  negative genitourinary   Musculoskeletal negative musculoskeletal ROS (+)   Abdominal   Peds  Hematology Eliquis   Anesthesia Other Findings   Reproductive/Obstetrics                            Anesthesia Physical Anesthesia Plan  ASA: III  Anesthesia Plan: MAC and Bier Block and Bier Block-LIDOCAINE ONLY   Post-op Pain Management:    Induction: Intravenous  PONV Risk Score and Plan: 1 and Propofol infusion, TIVA and Treatment may vary due to age or medical condition  Airway Management Planned: Natural Airway, Nasal Cannula and Simple Face Mask  Additional Equipment: None  Intra-op Plan:   Post-operative Plan:   Informed Consent: I have reviewed the patients History and Physical, chart, labs and discussed the procedure including the risks, benefits and alternatives for the proposed anesthesia with the patient or authorized representative who has indicated his/her understanding and acceptance.        Plan Discussed with:   Anesthesia Plan Comments:         Anesthesia Quick Evaluation

## 2020-10-13 NOTE — Anesthesia Postprocedure Evaluation (Signed)
Anesthesia Post Note  Patient: [name]  Procedure(s) Performed: LEFT INDEX FINGER EXCISION  MUCOID CYST AND DEBRIDEMENT DISTAL INTERPHALNGEAL JOINT AND POSSIBLE ROTATION FLAP (Left Finger)     Patient location during evaluation: PACU Anesthesia Type: MAC Level of consciousness: awake and alert Pain management: pain level controlled Vital Signs Assessment: post-procedure vital signs reviewed and stable Respiratory status: spontaneous breathing, nonlabored ventilation and respiratory function stable Cardiovascular status: blood pressure returned to baseline and stable Postop Assessment: no apparent nausea or vomiting Anesthetic complications: no   No complications documented.  Last Vitals:  Vitals:   10/13/20 1451 10/13/20 1500  BP: 96/60 98/64  Pulse: 63 61  Resp: 16 14  Temp: 36.6 C   SpO2: 99% 97%    Last Pain:  Vitals:   10/13/20 1500  TempSrc:   PainSc: 0-No pain                 Lidia Collum

## 2020-10-13 NOTE — Op Note (Addendum)
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  Left index finger debridement of DIP joint 2.  Left index finger excision of mucoid cyst   SURGEON:  Betha Loa, M.D.   ASSISTANT: none   ANESTHESIA:  Bier block with sedation   INTRAVENOUS FLUIDS:  Per anesthesia flow sheet.   ESTIMATED BLOOD LOSS:  Minimal.   COMPLICATIONS:  None.   SPECIMENS:   Left index finger mucoid cyst to pathology   TOURNIQUET TIME:    Total Tourniquet Time Documented: Forearm (Left) - 29 minutes Total: Forearm (Left) - 29 minutes    DISPOSITION:  Stable to PACU.   INDICATIONS: 80 year old Lyons has had a cyst on the left index finger for several months.  It is bothersome to him.  There is no deformity.  He wishes to have it removed and the DIP joint debrided to try to prevent recurrence. Risks, benefits and alternatives of surgery were discussed including the risks of blood loss, infection, damage to nerves, vessels, tendons, ligaments, bone for surgery, need for additional surgery, complications with wound healing, continued pain, stiffness, recurrence.  He voiced understanding of these risks and elected to proceed.  OPERATIVE COURSE:  After being identified preoperatively by myself,  the patient and I agreed on the procedure and site of the procedure.  The surgical site was marked.  Surgical consent had been signed. He was given IV antibiotics as preoperative antibiotic prophylaxis. He was transferred to the operating room and placed on the operating table in supine position with the Left upper extremity on an arm board.  Bier block anesthesia was induced  by the anesthesiologist.  Left upper extremity was prepped and draped in normal sterile orthopedic fashion.  A surgical pause was performed between the surgeons, anesthesia, and operating room staff and all were in agreement as to the patient, procedure, and site of procedure.  Tourniquet at the proximal aspect of the forearm had been inflated for the Bier block.    A hockey-stick shaped incision was made at the DIP joint of the left index finger.  This was carried into subcutaneous tissues by spreading technique.  The cyst was identified under the dorsal nail fold.  The skin was thinned very distally at the cuticle that was thicker more proximally.  The cyst was removed as best possible.  It was small and filled with minimal fluid.  The skin was thinned distally.  It did not appear however that a rotation flap would be necessary.  The DIP joint was entered underneath the extensor tendon.  There was some cyst under the extensor tendon.  This was removed as well.  The cyst was sent to pathology for examination.  There was a osteophyte dorsally on the middle phalanx at the radial side of the joint which was debrided with a synovectomy rongeurs.  A synovectomy was also performed in the joint.  The extensor tendon was poorly attached at the radial side.  The ulnar half was attached.  The wound and DIP joint were copiously irrigated with sterile saline.  The wound was closed with 4-0 nylon suture in a horizontal mattress fashion.  A digital block was performed with quarter percent plain Marcaine in postoperative analgesia.  The wound was dressed with sterile Xeroform 4 x 4 and wrapped with a Coban dressing lightly.  An AlumaFoam splint was placed and wrapped light with Coban dressing.  The tourniquet was deflated at 29 minutes.  Fingertips were pink with brisk capillary refill after deflation of tourniquet.  The operative  drapes were broken down.  The patient was awoken from anesthesia safely.  He was transferred back  to the stretcher and taken to PACU in stable condition.  I will see him back in the office in 1 week for postoperative followup.  I will give him a prescription for Norco 5/325 1-2 tabs PO q6 hours prn pain, dispense # 15.   Betha Loa, MD Electronically signed, 10/13/20

## 2020-10-13 NOTE — Anesthesia Procedure Notes (Signed)
Anesthesia Regional Block: Bier block (IV Regional)   Pre-Anesthetic Checklist: ,, timeout performed, Correct Patient, Correct Site, Correct Laterality, Correct Procedure, Correct Position, site marked, Risks and benefits discussed, Surgical consent,  Pre-op evaluation,  At surgeon's request  Laterality: Left  Prep: alcohol swabs        Procedures:,,,,,, Esmarch exsanguination, single tourniquet utilized, #20gu IV placed  Narrative:  Start time: 10/13/2020 2:14 PM End time: 10/13/2020 2:16 PM Injection made incrementally with aspirations every 30 mL.  Performed by: Personally  CRNA: Thornell Mule, CRNA

## 2020-10-14 ENCOUNTER — Encounter (HOSPITAL_BASED_OUTPATIENT_CLINIC_OR_DEPARTMENT_OTHER): Payer: Self-pay | Admitting: Orthopedic Surgery

## 2020-10-14 LAB — SURGICAL PATHOLOGY

## 2020-10-18 IMAGING — CT CT L SPINE W/ CM
1 series · 12 of 14 positions shown, 15 images · non-contrast
Comparison: MRI lumbar spine dated September 07, 2016.

CLINICAL DATA: Progressively worsening intermittent chronic low
back pain radiating down the outside of the left leg to the mid
calf. Remote prior surgery.
TECHNIQUE: Contiguous axial images were obtained through the lumbar spine after
the intrathecal infusion of contrast. Coronal and sagittal
reconstructions were obtained of the axial image sets.

[Series 3: l spine soft · axial · 0.33mm/px · z∈[-1007,-821]mm · 12 of 74 slices shown, 15 images]
[im 6/74  soft-tissue]
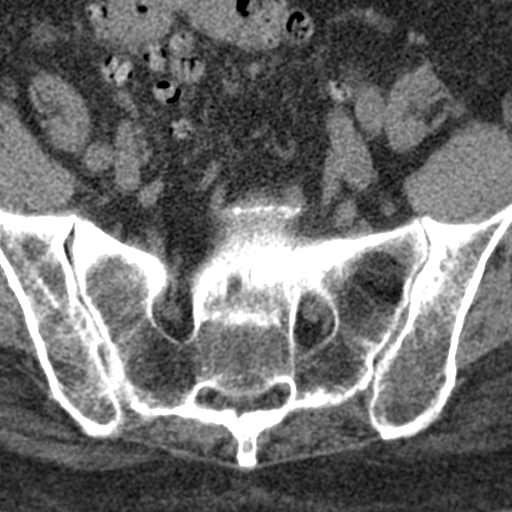
[im 6/74  bone]
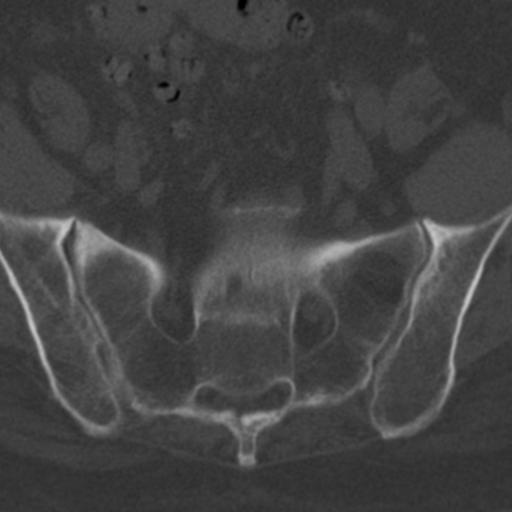
[im 12/74  bone]
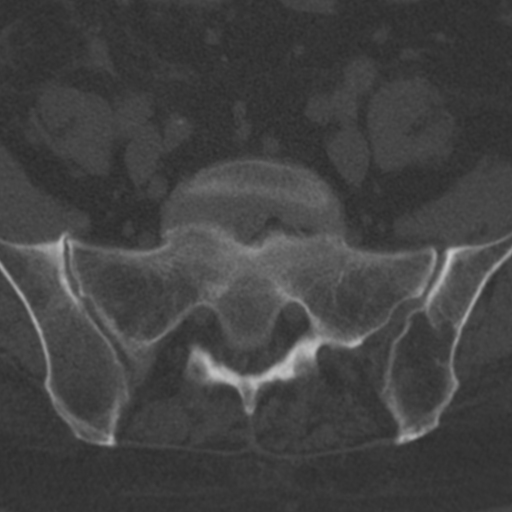
[im 17/74  bone]
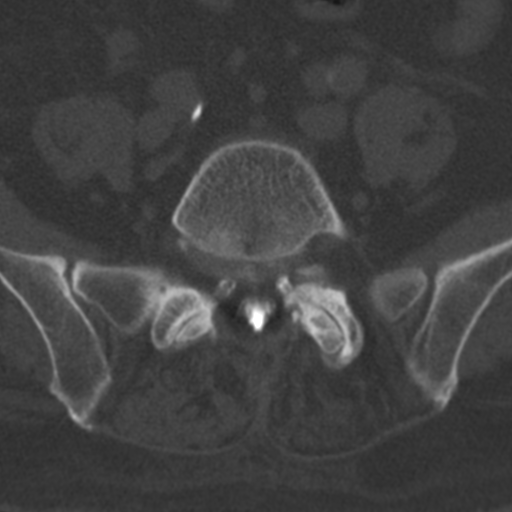
[im 23/74  bone]
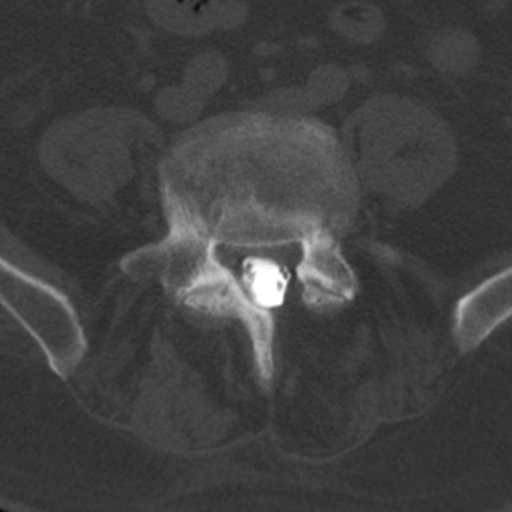
[im 29/74  soft-tissue]
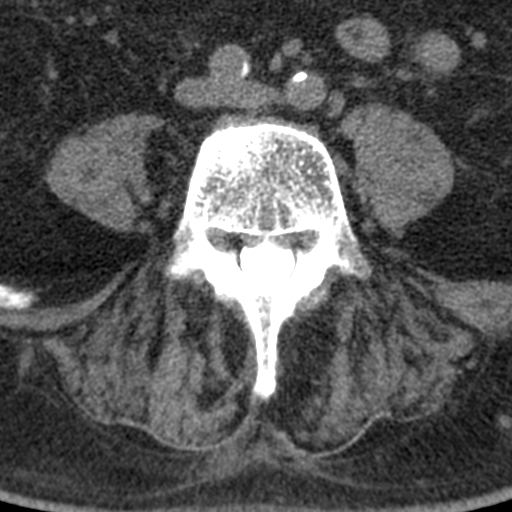
[im 29/74  bone]
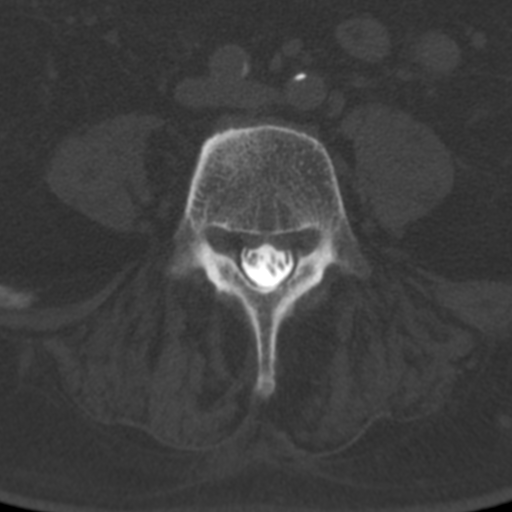
[im 34/74  bone]
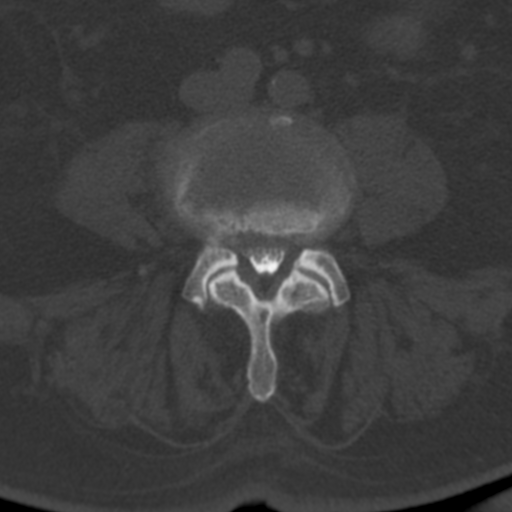
[im 40/74  bone]
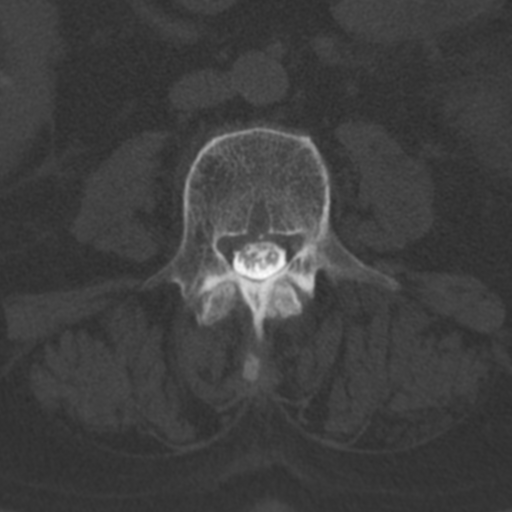
[im 45/74  bone]
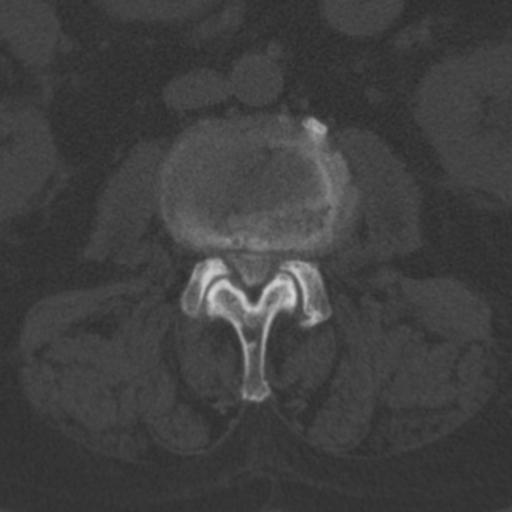
[im 51/74  soft-tissue]
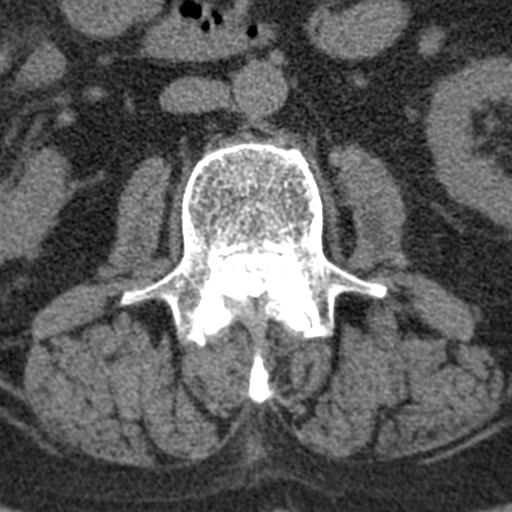
[im 51/74  bone]
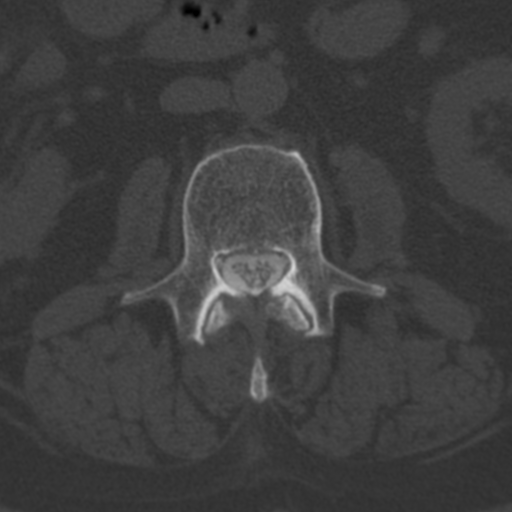
[im 57/74  bone]
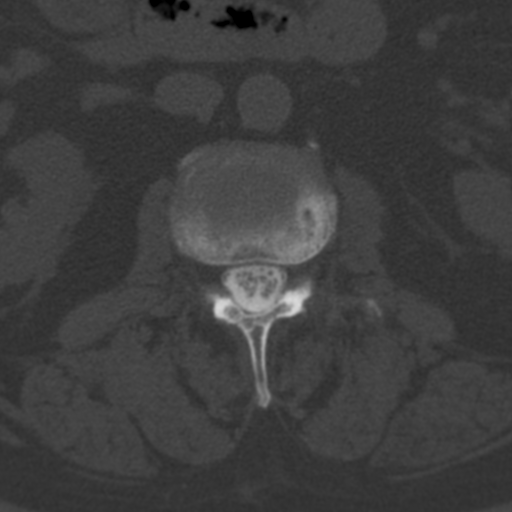
[im 62/74  bone]
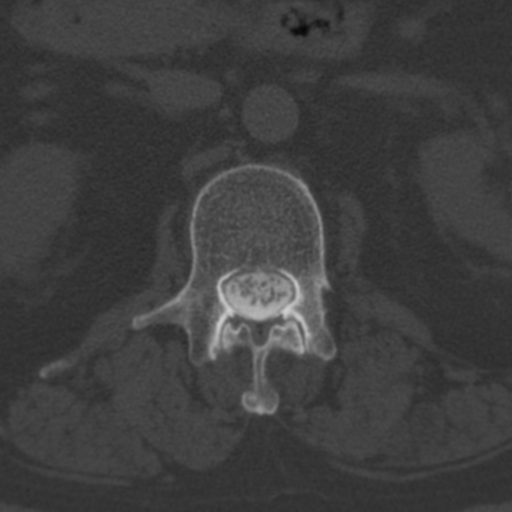
[im 68/74  bone]
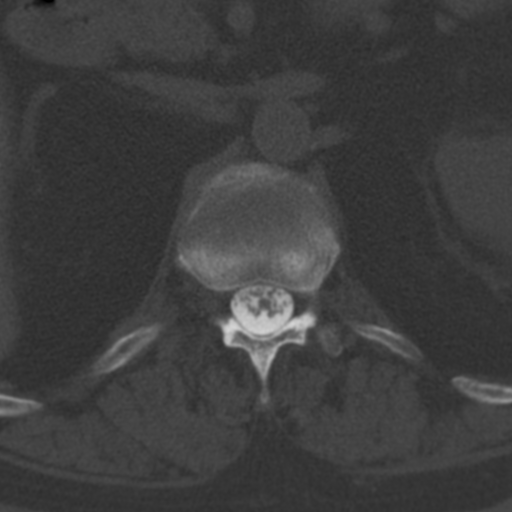

[12 of 14 positions shown; findings below may reference images not displayed]

EXAM:
LUMBAR MYELOGRAM

CT LUMBAR MYELOGRAM

FLUOROSCOPY TIME:  Radiation Exposure Index (as provided by the
fluoroscopic device): 19.8 mGy

Fluoroscopy Time:  58 seconds

Number of Acquired Images:  16

PROCEDURE:
After thorough discussion of risks and benefits of the procedure
including bleeding, infection, injury to nerves, blood vessels,
adjacent structures as well as headache and CSF leak, written and
oral informed consent was obtained. Consent was obtained by Dr.
Vinsen Halim. Time out form was completed.

Patient was positioned prone on the fluoroscopy table. Local
anesthesia was provided with 1% lidocaine without epinephrine after
prepped and draped in the usual sterile fashion. Puncture was
performed at L1-L2 using a 3 1/2 inch 22-gauge spinal needle via
right interlaminar approach. Using a single pass through the dura,
the needle was placed within the thecal sac, with return of clear
CSF. 15 mL of Isovue [id number] was injected into the thecal sac, with
normal opacification of the nerve roots and cauda equina consistent
with free flow within the subarachnoid space.

I personally performed the lumbar puncture and administered the
intrathecal contrast. I also personally supervised acquisition of
the myelogram images.
FINDINGS: LUMBAR MYELOGRAM FINDINGS:

Trace anterolisthesis at L5-S1. No dynamic instability. Small
ventral extradural defects at L1-L2 and L2-L3. Moderate ventral
extradural defects at L3-L4 and L4-L5. Moderate spinal canal
stenosis at L2-L3. Mild spinal canal stenosis at L3-L4. Underfilling
of the bilateral L3, L4, and L5 nerve roots.

CT LUMBAR MYELOGRAM FINDINGS:

Segmentation: Standard.

Alignment: Unchanged trace anterolisthesis at L5-S1.

Vertebrae: No acute fracture or other focal pathologic process.

Conus medullaris and cauda equina: Conus extends to the T12-L1
level. Conus and cauda equina appear normal.

Paraspinal and other soft tissues: Aortoiliac atherosclerotic
vascular disease. Sigmoid diverticulosis. Partially visualized
nonobstructive right renal calculus.

Disc levels:

T12-L1:  Unchanged minimal disc bulging.  No stenosis.

L1-L2: Unchanged minimal disc bulging and mild bilateral facet
arthropathy. Unchanged mild left lateral recess stenosis. No spinal
canal or neuroforaminal stenosis.

L2-L3: Unchanged small circumferential disc osteophyte complex with
mild bilateral facet arthropathy and ligamentum flavum hypertrophy.
Unchanged moderate spinal canal stenosis. Unchanged moderate left
and mild right lateral recess stenosis. Unchanged moderate left and
borderline mild right neuroforaminal stenosis.

L3-L4: Unchanged mild disc bulging and asymmetric right-sided
endplate spurring. Unchanged mild bilateral facet arthropathy.
Unchanged mild spinal canal and bilateral lateral recess stenosis.
No neuroforaminal stenosis.

L4-L5: Prior left hemilaminectomy. Unchanged small circumferential
disc osteophyte complex asymmetrically involving the right lateral
recess and neural foramen. Unchanged moderate bilateral facet
arthropathy. Unchanged moderate bilateral lateral recess stenosis.
Unchanged moderate left and mild right neuroforaminal stenosis. No
spinal canal stenosis.

L5-S1: Unchanged mild disc degeneration without significant bulge or
herniation. Unchanged severe left and moderate right facet
arthropathy. No stenosis.
IMPRESSION: 1. Multilevel lumbar spondylosis as described above, overall similar
to prior MRI from [DATE]. Unchanged moderate spinal canal, left lateral recess, and left
neuroforaminal stenosis at L2-L3.
3. Unchanged moderate bilateral lateral recess and left
neuroforaminal stenosis at L4-L5.
4. Nonobstructive right nephrolithiasis.
5.  Aortic atherosclerosis ([id number]).

## 2020-12-15 ENCOUNTER — Encounter: Payer: Self-pay | Admitting: Neurology

## 2020-12-15 ENCOUNTER — Ambulatory Visit: Payer: Medicare PPO | Admitting: Neurology

## 2020-12-15 VITALS — BP 151/80 | HR 60 | Ht 71.5 in | Wt 219.5 lb

## 2020-12-15 DIAGNOSIS — G4733 Obstructive sleep apnea (adult) (pediatric): Secondary | ICD-10-CM

## 2020-12-15 DIAGNOSIS — I2510 Atherosclerotic heart disease of native coronary artery without angina pectoris: Secondary | ICD-10-CM | POA: Diagnosis not present

## 2020-12-15 DIAGNOSIS — Z9989 Dependence on other enabling machines and devices: Secondary | ICD-10-CM

## 2020-12-15 DIAGNOSIS — R519 Headache, unspecified: Secondary | ICD-10-CM | POA: Insufficient documentation

## 2020-12-15 DIAGNOSIS — K219 Gastro-esophageal reflux disease without esophagitis: Secondary | ICD-10-CM | POA: Insufficient documentation

## 2020-12-15 DIAGNOSIS — I10 Essential (primary) hypertension: Secondary | ICD-10-CM | POA: Insufficient documentation

## 2020-12-15 DIAGNOSIS — R0681 Apnea, not elsewhere classified: Secondary | ICD-10-CM | POA: Insufficient documentation

## 2020-12-15 NOTE — Progress Notes (Signed)
SLEEP MEDICINE CLINIC    Provider:  Melvyn Novas, MD  Primary Care Physician:  James Gosselin, MD [address] [place] [address]     Referring Provider:  Dr James Fanny, MD at [facility]         Chief Complaint according to patient   Patient presents with:     New Patient (Initial Visit)           HISTORY OF PRESENT ILLNESS:  [name] is a 80 y.o. year old White or Caucasian male patient seen here as a referral on 12/15/2020 from  PCP at [facility]-  Dr James Fanny, MD. .  Chief concern according to patient :  having used CPAP 5 years or longer, time for re-evaluation.    I have the pleasure of seeing [name] today, a right -handed White or Caucasian male with a known OSA sleep disorder. He was diagnosed with OSA at [facility] in [place] after he had unexplained high BP  readings about 13 years ago-  has a  has a past medical history of CAD (coronary artery disease), native coronary artery (06/2000), Diabetes mellitus type 2, noninsulin dependent (HCC), Dyslipidemia (high LDL; low HDL), Enlarged prostate, GERD (gastroesophageal reflux disease), H/O hiatal hernia, History of bronchitis, History of hematuria, Hypertension, uncontrolled (06/2000), Migraines, OSA on CPAP, bradycardia-  PAF (paroxysmal atrial fibrillation) (HCC), Presence of bare metal stent in LAD coronary artery (06/2000), Presence of permanent cardiac pacemaker, and S/P angioplasty with stent, placement of 2 overlapping DES Xience stents, to prox. Mid Lad and PTCA of distal LAD- 12/08/11 (12/09/2011).      Sleep relevant medical history: CPAP, Nocturia 1-2 times, BPH,  Family medical /sleep history: No siblings, son has tested negative-with OSA. Social history:  Patient is working as / retired from Agricultural consultant , was a Animal nutritionist for 31 years, [employer] [phone]-   He lives in a household with spouse, has adult children- daughter and son, wife is on CPAP. The patient currently is  retired-  Tobacco use- none .  ETOH use rare ,  Caffeine intake in form of Coffee(  decaffeinated ) Soda( 1/ week ) Tea ( decaff) or energy drinks. Regular exercise in form of walk, yard work, farming.       Sleep habits are as follows: The patient's dinner time is between 5-7 PM. The patient goes to bed at 10 PM and continues to sleep for 5-6 hours, wakes for 5-8 bathroom breaks.   The preferred sleep position is sideways, but often in a recliner-  , with the support of 2 pillows.  Dreams are reportedly infrequent/vivid. History of GERD-  4-5  AM is the usual rise time. The patient wakes up spontaneously. He reports not feeling refreshed or restored in AM, with symptoms such as dry mouth, morning headaches, and residual fatigue. Naps are taken frequently in a recliner , lasting from 5 to 60  minutes and are more refreshing than nocturnal sleep.    Review of Systems: Out of a complete 14 system review, the patient complains of only the following symptoms, and all other reviewed systems are negative.:  Fatigue, sleepiness , snoring, fragmented sleep,NOCTURIA while on CPAP-  How likely are you to doze in the following situations: 0 = not likely, 1 = slight chance, 2 = moderate chance, 3 = high chance   Sitting and Reading? Watching Television? Sitting inactive in a public place (theater or meeting)? As a passenger in  a car for an hour without a break? Lying down in the afternoon when circumstances permit? Sitting and talking to someone? Sitting quietly after lunch without alcohol? In a car, while stopped for a few minutes in traffic?   Total = 10/ 24 points   FSS endorsed at 20/ 63 points.   Social History   Socioeconomic History   Marital status: Married    Spouse name: miina   Number of children: 3   Years of education: Not on file   Highest education level: Master's degree (e.g., MA, MS, MEng, MEd, MSW, MBA)  Occupational History   Not on file  Tobacco Use   Smoking status:  Never   Smokeless tobacco: Never  Substance and Sexual Activity   Alcohol use: No   Drug use: No   Sexual activity: Yes  Other Topics Concern   Not on file  Social History Narrative   Live with wife   Right handed   Caffeine: no caffeine    Social Determinants of Corporate investment bankerHealth   Financial Resource Strain: Not on file  Food Insecurity: Not on file  Transportation Needs: Not on file  Physical Activity: Not on file  Stress: Not on file  Social Connections: Not on file    Family History  Problem Relation Age of Onset   Alzheimer's disease Mother    Cancer Mother    Heart attack Father     Past Medical History:  Diagnosis Date   CAD (coronary artery disease), native coronary artery 06/2000   PCI to Proximal LAD 3.0 mm x 15 mm BMS   Diabetes mellitus type 2, noninsulin dependent (HCC)    complicated by CAD   Dyslipidemia (high LDL; low HDL)    Enlarged prostate    GERD (gastroesophageal reflux disease)    H/O hiatal hernia    History of bronchitis    "3 or 4 episodes over my lifetime" (12/08/11)   History of hematuria    no longer on warfarin   Hypertension, uncontrolled 06/2000   Migraines    "used to; not anymore"   OSA on CPAP    uses cpap   PAF (paroxysmal atrial fibrillation) (HCC)    s/p RF ablation; not on warfartin   Presence of bare metal stent in LAD coronary artery 06/2000   3.0 mm x 15 mm BMS   Presence of permanent cardiac pacemaker    S/P angioplasty with stent, placement of 2 overlapping DES Xience stents, to prox. Mid Lad and PTCA of distal LAD.  12/08/11 12/09/2011    Past Surgical History:  Procedure Laterality Date   APPENDECTOMY  ~ 1976   BACK SURGERY     CARDIAC ELECTROPHYSIOLOGY STUDY AND ABLATION  ~ 2008   CATARACT EXTRACTION W/ INTRAOCULAR LENS  IMPLANT, BILATERAL  2012   CHOLECYSTECTOMY  ~ 1976   CORONARY ANGIOPLASTY WITH STENT PLACEMENT  06/2000   "1"   CORONARY ANGIOPLASTY WITH STENT PLACEMENT  12/08/11   "2 today; total of 3 now"   CYST  EXCISION Left 10/13/2020   Procedure: LEFT INDEX FINGER EXCISION  MUCOID CYST AND DEBRIDEMENT DISTAL INTERPHALNGEAL JOINT AND POSSIBLE ROTATION FLAP;  Surgeon: Betha LoaKuzma, Kevin, MD;  Location: [facility];  Service: Orthopedics;  Laterality: Left;   LEFT HEART CATHETERIZATION WITH CORONARY ANGIOGRAM N/A 12/08/2011   Procedure: LEFT HEART CATHETERIZATION WITH CORONARY ANGIOGRAM;  Surgeon: Marykay Lexavid W Harding, MD;  [facility];  Service: Cardiovascular;  Laterality: N/A;   LUMBAR DISC SURGERY  1996   "  partial removal"   PERCUTANEOUS CORONARY STENT INTERVENTION (PCI-S)  12/08/2011   Procedure: PERCUTANEOUS CORONARY STENT INTERVENTION (PCI-S);  Surgeon: Marykay Lex, MD;  Location: [facility] C[facility] LAB;  Service: Cardiovascular;;   PROSTATE SURGERY  2005-2008   "several exploratory OR's to determine why I was bleeding; 3 or 4"   RENAL ANGIOGRAM N/A 12/08/2011   Procedure: RENAL ANGIOGRAM;  Surgeon: Marykay Lex, MD;  Location: [facility] C[facility] LAB;  Service: Cardiovascular;  Laterality: N/A;   TONSILLECTOMY AND ADENOIDECTOMY  1952     Current Outpatient Medications on File Prior to Visit  Medication Sig Dispense Refill   alfuzosin (UROXATRAL) 10 MG 24 hr tablet TAKE 1 TABLET BY MOUTH AT BEDTIME     apixaban (ELIQUIS) 5 MG TABS tablet Take 5 mg by mouth 2 (two) times daily.     aspirin EC 81 MG tablet Take 81 mg by mouth daily.     atenolol (TENORMIN) 50 MG tablet Take 1 tablet by mouth 2 (two) times daily.     beta carotene w/minerals (OCUVITE) tablet Take 1 tablet by mouth daily.     Coenzyme Q10 (CO Q-10) 200 MG CAPS Take 1 tablet by mouth daily.     empagliflozin (JARDIANCE) 25 MG TABS tablet TAKE 1 TABLET BY MOUTH EVERY DAY     glipiZIDE (GLUCOTROL) 10 MG tablet Take 10 mg by mouth 2 (two) times daily before a meal.     melatonin 5 MG TABS Take 5 mg by mouth at bedtime as needed.     metFORMIN (GLUCOPHAGE) 500 MG tablet Take 1,000 mg by mouth 2 (two) times daily with a meal. Was on hold due  to procedure     omeprazole (PRILOSEC) 20 MG capsule Take 20 mg by mouth 2 (two) times daily before a meal.     rosuvastatin (CRESTOR) 20 MG tablet Take 20 mg by mouth daily.     sitaGLIPtin (JANUVIA) 100 MG tablet Take 100 mg by mouth daily.     telmisartan (MICARDIS) 80 MG tablet Take 40 mg by mouth daily.     No current facility-administered medications on file prior to visit.    Allergies  Allergen Reactions   Brilinta [Ticagrelor] Anaphylaxis   Dopamine Palpitations and Other (See Comments)    "years ago; shook 15 minutes after they stopped giving it to me"   Tetanus Toxoids Swelling    "Oraserum Tetanus"   Warfarin Sodium Other (See Comments)    Hematuria    Fish Oil Other (See Comments)    Hematuria     Physical exam:  Today's Vitals   12/15/20 0843  BP: (!) 151/80  Pulse: 60  Weight: 219 lb 8 oz (99.6 kg)  Height: 5' 11.5" (1.816 m)   Body mass index is 30.19 kg/m.   Wt Readings from Last 3 Encounters:  12/15/20 219 lb 8 oz (99.6 kg)  10/13/20 218 lb 4.1 oz (99 kg)  06/24/15 234 lb (106.1 kg)     Ht Readings from Last 3 Encounters:  12/15/20 5' 11.5" (1.816 m)  10/13/20 5\' 11"  (1.803 m)  06/24/15 6' (1.829 m)      General: The patient is awake, alert and appears not in acute distress. The patient is well groomed. Head: Normocephalic, atraumatic. Neck is supple. Mallampati 1,  neck circumference:18 inches .  Nasal airflow  patent.  Retrognathia is not seen.  Dental status: intact, biological  Cardiovascular:  Regular rate and cardiac rhythm by pulse,  without distended neck veins. Respiratory:  Lungs are clear to auscultation.  Skin:  Without evidence of ankle edema, or rash. Trunk: The patient's posture is erect.   Neurologic exam : The patient is awake and alert, oriented to place and time.   Memory subjective described as intact.  Attention span & concentration ability appears normal.  Speech is fluent,  without  dysarthria, dysphonia or aphasia.   Mood and affect are appropriate.   Cranial nerves: no loss of smell or taste reported  Pupils are equal and briskly reactive to light. Funduscopic exam  deferred. See Dr. Barbaraann Barthel notes.  Extraocular movements in vertical and horizontal planes were intact and without nystagmus. No Diplopia. Visual fields by finger perimetry are intact. Hearing was intact to soft voice and finger rubbing.  Facial sensation intact to fine touch.  Facial motor strength is symmetric and tongue and uvula move midline.  Neck ROM : rotation, tilt and flexion extension were normal for age and shoulder shrug was symmetrical.    Motor exam:  Symmetric bulk, tone and ROM.   Normal tone without cog wheeling, symmetric grip strength .   Sensory:  Fine touch and vibration were absent at ankle, reduced at knees.  Proprioception tested in the upper extremities was normal.   Coordination: Rapid alternating movements in the fingers/hands were of normal speed.  The Finger-to-nose maneuver was intact without evidence of ataxia, dysmetria or tremor.   Gait and station: Patient could rise unassisted from a seated position, walked without assistive device.  Stance is of normal width/ base and the patient turned with  steps.  Toe and heel walk were deferred.  Deep tendon reflexes: in the  upper extremities are symmetric and intact.  Right leg is missing the patella reflex, and achilles, has been radiculopathy L 4-5. Babinski response was deferred.        After spending a total time of 35  minutes face to face and additional time for physical and neurologic examination, review of laboratory studies,  personal review of imaging studies, reports and results of other testing and review of referral information / records as far as provided in visit, I have established the following assessments:  I had the pleasure of meeting Mr. James a Lyons today, a longtime established CPAP user with a track record of CPAP compliance that has  been excellent he has using his been using CPAP 97% of the nights over the last 30 days but he does cut his night short because he actually sleeps in a recliner before he transfers to his bedroom so a lot of his nights are less than 4 hours of use.  This will limit the benefit of CPAP to some degree in a patient who has a history of atrial fibrillation is on chronic anticoagulation, has diabetes mellitus with retinopathy, has a pacemaker for bradycardia, multiple stents for coronary artery disease, and probably a moderate degree of diabetic neuropathy.  I doubt that he has just upset airway related sleep apnea as his Mallampati is over low-grade and his neck circumference at 18 inches is not that large he also does have patent nasal airflow.  And his teeth do not show a crowding he does not have retrognathia.  I like very much to obtain a new baseline, either by HST to by in lab testing. He has used a pressure of 16 cm water- with 3 cm EPR.  a new machine will have autotitration capability- He is a FFM user.    My Plan is to proceed with:  1) HST, or SPLIT.     I would like to thank Dr Luciana Axe, MD-  James Gosselin, MD [address] [place],  [place] [address] for allowing me to meet with and to take care of this pleasant patient.   In short, [name] is presenting with OSA on CPAP- I plan to follow up either personally or through our NP within 2-4 month.   CC: I will share my notes with PCP.  Electronically signed by: James Novas, MD 12/15/2020 9:00 AM  [facility] and [facility] Board certified by The [employer] of Sleep Medicine and Diplomate of the [employer] of Sleep Medicine. Board certified In Neurology through the ABPN, Fellow of the [employer] of Neurology. Medical Director of [facility].

## 2020-12-15 NOTE — Patient Instructions (Signed)

## 2020-12-23 DIAGNOSIS — Z85828 Personal history of other malignant neoplasm of skin: Secondary | ICD-10-CM | POA: Diagnosis not present

## 2020-12-23 DIAGNOSIS — L309 Dermatitis, unspecified: Secondary | ICD-10-CM | POA: Diagnosis not present

## 2020-12-23 DIAGNOSIS — B078 Other viral warts: Secondary | ICD-10-CM | POA: Diagnosis not present

## 2020-12-23 DIAGNOSIS — D225 Melanocytic nevi of trunk: Secondary | ICD-10-CM | POA: Diagnosis not present

## 2020-12-23 DIAGNOSIS — L570 Actinic keratosis: Secondary | ICD-10-CM | POA: Diagnosis not present

## 2020-12-23 DIAGNOSIS — L905 Scar conditions and fibrosis of skin: Secondary | ICD-10-CM | POA: Diagnosis not present

## 2021-01-02 DIAGNOSIS — R1310 Dysphagia, unspecified: Secondary | ICD-10-CM | POA: Diagnosis not present

## 2021-01-02 DIAGNOSIS — K2950 Unspecified chronic gastritis without bleeding: Secondary | ICD-10-CM | POA: Diagnosis not present

## 2021-01-02 DIAGNOSIS — K225 Diverticulum of esophagus, acquired: Secondary | ICD-10-CM | POA: Diagnosis not present

## 2021-01-02 DIAGNOSIS — K5730 Diverticulosis of large intestine without perforation or abscess without bleeding: Secondary | ICD-10-CM | POA: Diagnosis not present

## 2021-01-02 DIAGNOSIS — K449 Diaphragmatic hernia without obstruction or gangrene: Secondary | ICD-10-CM | POA: Diagnosis not present

## 2021-01-02 DIAGNOSIS — K635 Polyp of colon: Secondary | ICD-10-CM | POA: Diagnosis not present

## 2021-01-02 DIAGNOSIS — Z86010 Personal history of colonic polyps: Secondary | ICD-10-CM | POA: Diagnosis not present

## 2021-01-06 DIAGNOSIS — K635 Polyp of colon: Secondary | ICD-10-CM | POA: Diagnosis not present

## 2021-01-07 ENCOUNTER — Ambulatory Visit (INDEPENDENT_AMBULATORY_CARE_PROVIDER_SITE_OTHER): Payer: Medicare PPO | Admitting: Neurology

## 2021-01-07 DIAGNOSIS — G4733 Obstructive sleep apnea (adult) (pediatric): Secondary | ICD-10-CM

## 2021-01-07 DIAGNOSIS — K219 Gastro-esophageal reflux disease without esophagitis: Secondary | ICD-10-CM

## 2021-01-07 DIAGNOSIS — I2510 Atherosclerotic heart disease of native coronary artery without angina pectoris: Secondary | ICD-10-CM

## 2021-01-07 DIAGNOSIS — R519 Headache, unspecified: Secondary | ICD-10-CM

## 2021-01-19 NOTE — Progress Notes (Signed)
[facility]   HOME SLEEP TEST REPORT ( by Watch PAT)   STUDY DATA posted 01-19-2021:   DOB:  10/01/1940 MRN: [id number]   ORDERING CLINICIAN: Carmen Dohmeier, MD  REFERRING CLINICIAN: Viktoria Rankin, MD    CLINICAL INFORMATION/HISTORY: 12/15/2020:known OSA sleep disorder. He was diagnosed with OSA at [facility] in [place] after he had unexplained high BP  readings about 13 years ago-  has a  has a past medical history of CAD (coronary artery disease), native coronary artery (06/2000), Diabetes mellitus type 2, noninsulin dependent (HCC), Dyslipidemia (high LDL; low HDL), Enlarged prostate, GERD (gastroesophageal reflux disease), H/O hiatal hernia, History of bronchitis, History of hematuria, Hypertension, uncontrolled (06/2000), Migraines, OSA on CPAP, bradycardia-  PAF (paroxysmal atrial fibrillation) (HCC), Presence of bare metal stent in LAD coronary artery (06/2000), Presence of permanent cardiac pacemaker, and S/P angioplasty with stent, placement of 2 overlapping DES Xience stents, to prox. Mid Lad and PTCA of distal LAD- 12/08/11 (12/09/2011).  I like very much to obtain a new baseline, either by HST to by in lab testing. He has used a pressure of 16 cm water- with 3 cm EPR.The new machine will have autotitration capability- He is a FFM user.        Epworth sleepiness score: 10 /24. FSS 20/63 points    BMI: 30.9kg/m   Neck Circumference: 18"   FINDINGS:   Sleep Summary:   Total Recording Time (hours, min): The overall recording time for this home sleep test amounted to 7 hours and 42 minutes.  The total sleep time was 6 hours and 41 minutes according to the device algorithm.   Only 3.9% of total sleep time were REM (%) sleep.                                       Respiratory Indices:   Calculated pAHI (per hour): The calculated AHI per hour was 48.5 a differentiation between REM and non-REM REM sleep AHI was not possible.  The vast majority of these events were obstructive,  there was a positional component.  The AHI on the right side was 76.3/h sleeping prone 46.2/h and sleeping supine 47.1/h.                           Oxygen Saturation Statistics:     O2 Saturation Range (%): The oxygen saturation range varied between 84% at nadir and a maximum of 98% with a mean oxygenation of 92%.  Time below 89% saturation was 13.1 minutes with 3.3% of total sleep time.  Time under 90% oxygen saturation was 31 minutes with 7.7% of total sleep time.                                     Pulse Rate Statistics: Pacemaker /Pulse Range:  The patient's pulse range varied between 51 bpm and 102 bpm with a mean heart rate of 60 bpm.              IMPRESSION:  This HST confirms the presence of severe obstructive sleep apnea.  The patient had reported that he no longer dreams or at least very infrequently and this is confirmed by the no REM sleep proportion.  He has a history of gastroesophageal reflux disease.  Frequent nocturia even   while on his old CPAP machine. This is severe obstructive sleep apnea which appears to be worse when sleeping on the right side.     RECOMMENDATION:I would like for the patient to be outfitted with a mask of his choice, and an auto titration device with a beginning pressure of 7 cmH2O and up to 17 cmH2O to centimeter EPR.      INTERPRETING PHYSICIAN: Carmen Dohmeier, MD   Medical Director of [facility].            

## 2021-01-21 NOTE — Procedures (Signed)
[facility] SLEEP TEST REPORT ( by Watch PAT)   STUDY DATA posted 01-19-2021:   DOB:  11/30/1940 MRN: [id number]   ORDERING CLINICIAN: Melvyn Novas, MD  REFERRING CLINICIAN: Gardiner Coins, MD    CLINICAL INFORMATION/HISTORY: 12/15/2020:known OSA sleep disorder. He was diagnosed with OSA at [facility] in [place] after he had unexplained high BP  readings about 13 years ago-  has a  has a past medical history of CAD (coronary artery disease), native coronary artery (06/2000), Diabetes mellitus type 2, noninsulin dependent (HCC), Dyslipidemia (high LDL; low HDL), Enlarged prostate, GERD (gastroesophageal reflux disease), H/O hiatal hernia, History of bronchitis, History of hematuria, Hypertension, uncontrolled (06/2000), Migraines, OSA on CPAP, bradycardia-  PAF (paroxysmal atrial fibrillation) (HCC), Presence of bare metal stent in LAD coronary artery (06/2000), Presence of permanent cardiac pacemaker, and S/P angioplasty with stent, placement of 2 overlapping DES Xience stents, to prox. Mid Lad and PTCA of distal LAD- 12/08/11 (12/09/2011).  I like very much to obtain a new baseline, either by HST to by in lab testing. He has used a pressure of 16 cm water- with 3 cm EPR.The new machine will have autotitration capability- He is a FFM user.        Epworth sleepiness score: 10 /24. FSS 20/63 points    BMI: 30.9kg/m   Neck Circumference: 18"   FINDINGS:   Sleep Summary:   Total Recording Time (hours, min): The overall recording time for this home sleep test amounted to 7 hours and 42 minutes.  The total sleep time was 6 hours and 41 minutes according to the device algorithm.   Only 3.9% of total sleep time were REM (%) sleep.                                       Respiratory Indices:   Calculated pAHI (per hour): The calculated AHI per hour was 48.5 a differentiation between REM and non-REM REM sleep AHI was not possible.  The vast majority of these events were obstructive,  there was a positional component.  The AHI on the right side was 76.3/h sleeping prone 46.2/h and sleeping supine 47.1/h.                           Oxygen Saturation Statistics:     O2 Saturation Range (%): The oxygen saturation range varied between 84% at nadir and a maximum of 98% with a mean oxygenation of 92%.  Time below 89% saturation was 13.1 minutes with 3.3% of total sleep time.  Time under 90% oxygen saturation was 31 minutes with 7.7% of total sleep time.                                     Pulse Rate Statistics: Pacemaker /Pulse Range:  The patient's pulse range varied between 51 bpm and 102 bpm with a mean heart rate of 60 bpm.              IMPRESSION:  This HST confirms the presence of severe obstructive sleep apnea.  The patient had reported that he no longer dreams or at least very infrequently and this is confirmed by the no REM sleep proportion.  He has a history of gastroesophageal reflux disease.  Frequent nocturia even  while on his old CPAP machine. This is severe obstructive sleep apnea which appears to be worse when sleeping on the right side.     RECOMMENDATION:I would like for the patient to be outfitted with a mask of his choice, and an auto titration device with a beginning pressure of 7 cmH2O and up to 17 cmH2O to centimeter EPR.      INTERPRETING PHYSICIAN: Melvyn Novas, MD   Medical Director of [facility].

## 2021-01-21 NOTE — Progress Notes (Signed)
IMPRESSION:  This HST confirms the presence of severe obstructive sleep apnea.  The patient had reported that he no longer dreams or at least very infrequently and this is confirmed by the no REM sleep proportion.  He has a history of gastroesophageal reflux disease.  Frequent nocturia even while on his old CPAP machine. This is severe obstructive sleep apnea which appears to be worse when sleeping on the right side.    RECOMMENDATION:I would like for the patient to be outfitted with a mask of his choice, and an auto titration device with a beginning pressure of 7 cmH2O and up to 17 cmH2O to centimeter EPR.

## 2021-01-21 NOTE — Addendum Note (Signed)
Addended by: Melvyn Novas on: 01/21/2021 05:30 PM   Modules accepted: Orders

## 2021-01-22 ENCOUNTER — Telehealth: Payer: Self-pay | Admitting: Neurology

## 2021-01-22 NOTE — Telephone Encounter (Signed)
I called pt. I advised pt that Dr. Vickey Huger reviewed their sleep study results and found that pt has severe sleep apnea. Dr. Vickey Huger recommends that pt starts a new machine, auto CPAP 7-17 cm water pressure. I reviewed PAP compliance expectations with the pt. Pt is agreeable to starting a CPAP. I advised pt that an order will be sent to a DME, [facility], and [facility] will call the pt within about one week after they file with the pt's insurance. [facility] will show the pt how to use the machine, fit for masks, and troubleshoot the CPAP if needed. A follow up appt was made for insurance purposes with Dr. Vickey Huger on Dec 5,2022 at 8:30 am. Pt verbalized understanding to arrive 15 minutes early and bring their CPAP. A letter with all of this information in it will be mailed to the pt as a reminder. I verified with the pt that the address we have on file is correct. Pt verbalized understanding of results. Pt had no questions at this time but was encouraged to call back if questions arise. I have sent the order to [facility] and have received confirmation that they have received the order.

## 2021-01-22 NOTE — Telephone Encounter (Signed)
-----   Message from Melvyn Novas, MD sent at 01/21/2021  5:30 PM EDT ----- IMPRESSION:  This HST confirms the presence of severe obstructive sleep apnea.  The patient had reported that he no longer dreams or at least very infrequently and this is confirmed by the no REM sleep proportion.  He has a history of gastroesophageal reflux disease.  Frequent nocturia even while on his old CPAP machine. This is severe obstructive sleep apnea which appears to be worse when sleeping on the right side.    RECOMMENDATION:I would like for the patient to be outfitted with a mask of his choice, and an auto titration device with a beginning pressure of 7 cmH2O and up to 17 cmH2O to centimeter EPR.

## 2021-01-22 NOTE — Telephone Encounter (Signed)
Per [facility] "patient is not eligible for a new machine until 10/11/2021" They did call and make the patient aware of this. Patient will have to have a office visit within 6 mths of getting the new machine. Once he gets the new machine then reschedule another initial CPAP visit.

## 2021-02-13 NOTE — Telephone Encounter (Signed)
Pt called states he spoke with [facility] and they informed him that the doctor needs to write an order for the same machine he already has until he gets a new one.

## 2021-02-16 ENCOUNTER — Other Ambulatory Visit: Payer: Self-pay | Admitting: *Deleted

## 2021-02-16 DIAGNOSIS — G4733 Obstructive sleep apnea (adult) (pediatric): Secondary | ICD-10-CM

## 2021-02-16 NOTE — Telephone Encounter (Signed)
Placed order for pt to get new supplies for current machine until he is eligible for new machine. Sent community message to [facility] to make them aware new order placed for pt

## 2021-02-16 NOTE — Progress Notes (Signed)
CM sent to [facility] 

## 2021-02-24 DIAGNOSIS — I480 Paroxysmal atrial fibrillation: Secondary | ICD-10-CM | POA: Diagnosis not present

## 2021-02-24 DIAGNOSIS — Z45018 Encounter for adjustment and management of other part of cardiac pacemaker: Secondary | ICD-10-CM | POA: Diagnosis not present

## 2021-02-24 DIAGNOSIS — Z45010 Encounter for checking and testing of cardiac pacemaker pulse generator [battery]: Secondary | ICD-10-CM | POA: Diagnosis not present

## 2021-03-16 DIAGNOSIS — I160 Hypertensive urgency: Secondary | ICD-10-CM | POA: Diagnosis not present

## 2021-03-16 DIAGNOSIS — G4733 Obstructive sleep apnea (adult) (pediatric): Secondary | ICD-10-CM | POA: Diagnosis not present

## 2021-03-25 ENCOUNTER — Telehealth: Payer: Self-pay | Admitting: Neurology

## 2021-03-25 MED ORDER — TRAZODONE HCL 50 MG PO TABS: 50.0000 mg | ORAL_TABLET | Freq: Every day | ORAL | 1 refills | Status: DC

## 2021-03-25 NOTE — Telephone Encounter (Signed)
Mrs demitris reports her husband is not sleeping through the night, waking up early, he has nocturia. And he struggles with CPAP. Trazodone trial ordered. 25-50 mg po at bedtime.

## 2021-04-09 DIAGNOSIS — Z45018 Encounter for adjustment and management of other part of cardiac pacemaker: Secondary | ICD-10-CM | POA: Diagnosis not present

## 2021-04-16 ENCOUNTER — Other Ambulatory Visit: Payer: Self-pay | Admitting: Neurology

## 2021-04-23 ENCOUNTER — Encounter: Payer: Self-pay | Admitting: Neurology

## 2021-04-27 ENCOUNTER — Ambulatory Visit: Payer: Medicare PPO | Admitting: Neurology

## 2021-04-27 ENCOUNTER — Encounter: Payer: Self-pay | Admitting: Neurology

## 2021-04-27 ENCOUNTER — Other Ambulatory Visit: Payer: Self-pay

## 2021-04-27 VITALS — BP 144/95 | HR 98 | Ht 71.5 in | Wt 219.5 lb

## 2021-04-27 DIAGNOSIS — Z9989 Dependence on other enabling machines and devices: Secondary | ICD-10-CM | POA: Diagnosis not present

## 2021-04-27 DIAGNOSIS — R351 Nocturia: Secondary | ICD-10-CM | POA: Diagnosis not present

## 2021-04-27 DIAGNOSIS — G4733 Obstructive sleep apnea (adult) (pediatric): Secondary | ICD-10-CM

## 2021-04-27 DIAGNOSIS — I480 Paroxysmal atrial fibrillation: Secondary | ICD-10-CM | POA: Diagnosis not present

## 2021-04-27 DIAGNOSIS — Z95820 Peripheral vascular angioplasty status with implants and grafts: Secondary | ICD-10-CM | POA: Diagnosis not present

## 2021-04-27 NOTE — Progress Notes (Signed)
SLEEP MEDICINE CLINIC    Provider:  Melvyn Novas, MD  Primary Care Physician:  Catha Gosselin, MD [address] [place] [address]     Referring Provider:  Dr Barton Fanny, MD at [email or web address].         Chief Complaint according to patient   Patient presents with:     RV           HISTORY OF PRESENT ILLNESS:  [name] is a 80 y.o. year old White or Caucasian male patient seen here in his RV on 04/27/2021 from  PCP at [facility]-  Dr [facility], MD. 04-27-2021:   Mr. Andal, last tested on 01-19-2021 HST.  AHI 48/ h. AHI sleeping on the right side was 78/h. 31 minutes of hypoxia.   "I sleep very well with it, but the nocturia still interrupts my sleep. Related to BPH. Epworth 5-24 points, keeping active." Compliance was 100% for days and 70% for hours,  4 h 49 min. Old CPAP was set at 16 cm water pressure and he got a new one - from [facility]. FFM. Residual AHI is  0.8/h. 95% pressure is 16 cm with 2 cm EPR.    Chief concern according to patient :  having used CPAP 5 years or longer, time for re-evaluation.    I have the pleasure of seeing [name] with a known OSA sleep disorder. He was diagnosed with OSA at [facility] in [place] after he had unexplained high BP  readings about 13 years ago-  has a  has a past medical history of CAD (coronary artery disease), native coronary artery (06/2000), Diabetes mellitus type 2, noninsulin dependent (HCC), Dyslipidemia (high LDL; low HDL), Enlarged prostate, GERD (gastroesophageal reflux disease), H/O hiatal hernia, History of bronchitis, History of hematuria, Hypertension, uncontrolled (06/2000), Migraines, OSA on CPAP, bradycardia-  PAF (paroxysmal atrial fibrillation) (HCC), Presence of bare metal stent in LAD coronary artery (06/2000), Presence of permanent cardiac pacemaker, and S/P angioplasty with stent, placement of 2 overlapping DES Xience stents, to prox. Mid Lad and PTCA of distal LAD- 12/08/11  (12/09/2011).      Sleep relevant medical history: CPAP, Nocturia 1-2 times, BPH,  Family medical /sleep history: No siblings, son has tested negative-with OSA. Social history:  Patient is working as / retired from 12/11/2011 , was a Agricultural consultant for 31 years, [employer] nutritionist [phone]-   He lives in a household with spouse, has adult children- daughter and son, wife is on CPAP. The patient currently is retired-  Tobacco use- none .  ETOH use rare ,  Caffeine intake in form of Coffee(  decaffeinated ) Soda( 1/ week ) Tea ( decaff) or energy drinks. Regular exercise in form of walk, yard work, farming.       Sleep habits are as follows: The patient's dinner time is between 5-7 PM. The patient goes to bed at 10 PM and continues to sleep for 5-6 hours, wakes for 5-8 bathroom breaks.   The preferred sleep position is sideways, but often in a recliner-  , with the support of 2 pillows.  Dreams are reportedly infrequent/vivid. History of GERD-  4-5  AM is the usual rise time. The patient wakes up spontaneously. He reports not feeling refreshed or restored in AM, with symptoms such as dry mouth, morning headaches, and residual fatigue. Naps are taken frequently in a recliner , lasting from 5 to 60  minutes and are more refreshing than  nocturnal sleep.    Review of Systems: Out of a complete 14 system review, the patient complains of only the following symptoms, and all other reviewed systems are negative.:  Fatigue, sleepiness , snoring, fragmented sleep,NOCTURIA while on CPAP-  How likely are you to doze in the following situations: 0 = not likely, 1 = slight chance, 2 = moderate chance, 3 = high chance   Sitting and Reading? Watching Television? Sitting inactive in a public place (theater or meeting)? As a passenger in a car for an hour without a break? Lying down in the afternoon when circumstances permit? Sitting and talking to someone? Sitting quietly after lunch without alcohol? In a car,  while stopped for a few minutes in traffic?   Total = 5 down from 10/ 24 points on new CPAP.   FSS endorsed at 20/ 63 points.   Social History   Socioeconomic History   Marital status: Married    Spouse name: [name]   Number of children: 3   Years of education: Not on file   Highest education level: Master's degree (e.g., MA, MS, MEng, MEd, MSW, MBA)  Occupational History   Not on file  Tobacco Use   Smoking status: Never   Smokeless tobacco: Never  Substance and Sexual Activity   Alcohol use: No   Drug use: No   Sexual activity: Yes  Other Topics Concern   Not on file  Social History Narrative   Live with wife   Right handed   Caffeine: no caffeine    Social Determinants of Radio broadcast assistant Strain: Not on file  Food Insecurity: Not on file  Transportation Needs: Not on file  Physical Activity: Not on file  Stress: Not on file  Social Connections: Not on file    Family History  Problem Relation Age of Onset   Alzheimer's disease Mother    Cancer Mother    Heart attack Father     Past Medical History:  Diagnosis Date   CAD (coronary artery disease), native coronary artery 06/2000   PCI to Proximal LAD 3.0 mm x 15 mm BMS   Diabetes mellitus type 2, noninsulin dependent ([place])    complicated by CAD   Dyslipidemia (high LDL; low HDL)    Enlarged prostate    GERD (gastroesophageal reflux disease)    H/O hiatal hernia    History of bronchitis    "3 or 4 episodes over my lifetime" (12/08/11)   History of hematuria    no longer on warfarin   Hypertension, uncontrolled 06/2000   Migraines    "used to; not anymore"   OSA on CPAP    uses cpap   PAF (paroxysmal atrial fibrillation) (HCC)    s/p RF ablation; not on warfartin   Presence of bare metal stent in LAD coronary artery 06/2000   3.0 mm x 15 mm BMS   Presence of permanent cardiac pacemaker    S/P angioplasty with stent, placement of 2 overlapping DES Xience stents, to prox. Mid Lad and PTCA of  distal LAD.  12/08/11 12/09/2011    Past Surgical History:  Procedure Laterality Date   APPENDECTOMY  ~ [place]  ~ 2008   CATARACT EXTRACTION W/ INTRAOCULAR LENS  IMPLANT, BILATERAL  2012   CHOLECYSTECTOMY  ~ [place] WITH STENT PLACEMENT  06/2000   "1"   CORONARY ANGIOPLASTY WITH STENT PLACEMENT  12/08/11   "  2 today; total of 3 now"   CYST EXCISION Left 10/13/2020   Procedure: LEFT INDEX FINGER EXCISION  MUCOID CYST AND DEBRIDEMENT DISTAL INTERPHALNGEAL JOINT AND POSSIBLE ROTATION FLAP;  Surgeon: Leanora Cover, MD;  Location: [place];  Service: Orthopedics;  Laterality: Left;   LEFT HEART CATHETERIZATION WITH CORONARY ANGIOGRAM N/A 12/08/2011   Procedure: LEFT HEART CATHETERIZATION WITH CORONARY ANGIOGRAM;  Surgeon: Leonie Man, MD;  Location: [facility] CATH LAB;  Service: Cardiovascular;  Laterality: N/A;   [place]   "partial removal"   PERCUTANEOUS CORONARY STENT INTERVENTION (PCI-S)  12/08/2011   Procedure: PERCUTANEOUS CORONARY STENT INTERVENTION (PCI-S);  Surgeon: Leonie Man, MD;  Location: [facility] CATH LAB;  Service: Cardiovascular;;   PROSTATE SURGERY  2005-2008   "several exploratory OR's to determine why I was bleeding; 3 or 4"   RENAL ANGIOGRAM N/A 12/08/2011   Procedure: RENAL ANGIOGRAM;  Surgeon: Leonie Man, MD;  Location: [facility] CATH LAB;  Service: Cardiovascular;  Laterality: N/A;   TONSILLECTOMY AND ADENOIDECTOMY  1952     Current Outpatient Medications on File Prior to Visit  Medication Sig Dispense Refill   alfuzosin (UROXATRAL) 10 MG 24 hr tablet TAKE 1 TABLET BY MOUTH AT BEDTIME     apixaban (ELIQUIS) 5 MG TABS tablet Take 5 mg by mouth 2 (two) times daily.     aspirin EC 81 MG tablet Take 81 mg by mouth daily.     atenolol (TENORMIN) 50 MG tablet Take 1 tablet by mouth 2 (two) times daily.     beta carotene w/minerals (OCUVITE) tablet Take 1 tablet by mouth  daily.     Coenzyme Q10 (CO Q-10) 200 MG CAPS Take 1 tablet by mouth daily.     empagliflozin (JARDIANCE) 25 MG TABS tablet TAKE 1 TABLET BY MOUTH EVERY DAY     finasteride (PROSCAR) 5 MG tablet Take 1 tablet by mouth daily.     glipiZIDE (GLUCOTROL) 10 MG tablet Take 10 mg by mouth 2 (two) times daily before a meal.     melatonin 5 MG TABS Take 5 mg by mouth at bedtime as needed.     metFORMIN (GLUCOPHAGE) 500 MG tablet Take 1,000 mg by mouth 2 (two) times daily with a meal. Was on hold due to procedure     Multiple Vitamins-Minerals (PRESERVISION AREDS PO) Take 1 tablet by mouth daily.     omeprazole (PRILOSEC) 20 MG capsule Take 20 mg by mouth 2 (two) times daily before a meal.     rosuvastatin (CRESTOR) 20 MG tablet Take 20 mg by mouth daily.     sitaGLIPtin (JANUVIA) 100 MG tablet Take 100 mg by mouth daily.     telmisartan (MICARDIS) 80 MG tablet Take 40 mg by mouth daily.     No current facility-administered medications on file prior to visit.    Allergies  Allergen Reactions   Brilinta [Ticagrelor] Anaphylaxis   Dopamine Palpitations and Other (See Comments)    "years ago; shook 15 minutes after they stopped giving it to me"   Tetanus Toxoids Swelling    "Oraserum Tetanus"   Warfarin Sodium Other (See Comments)    Hematuria    Fish Oil Other (See Comments)    Hematuria     Physical exam:  Today's Vitals   04/27/21 0808  BP: (!) 144/95  Pulse: 98  Weight: 219 lb 8 oz (99.6 kg)  Height: 5' 11.5" (1.816 m)   Body mass index is 30.19 kg/m.  Wt Readings from Last 3 Encounters:  04/27/21 219 lb 8 oz (99.6 kg)  12/15/20 219 lb 8 oz (99.6 kg)  10/13/20 218 lb 4.1 oz (99 kg)     Ht Readings from Last 3 Encounters:  04/27/21 5' 11.5" (1.816 m)  12/15/20 5' 11.5" (1.816 m)  10/13/20 5\' 11"  (1.803 m)      General: The patient is awake, alert and appears not in acute distress. The patient is well groomed. Head: Normocephalic, atraumatic. Neck is supple. Mallampati 1,   neck circumference:18 inches .  Nasal airflow  patent.  Retrognathia is not seen.  Dental status: intact, biological  Cardiovascular:  Regular rate and cardiac rhythm by pulse,  without distended neck veins. Respiratory: Lungs are clear to auscultation.  Skin:  Without evidence of ankle edema, or rash. Trunk: The patient's posture is erect.   Neurologic exam : The patient is awake and alert, oriented to place and time.   Memory subjective described as intact.  Attention span & concentration ability appears normal.  Speech is fluent,  without  dysarthria, dysphonia or aphasia.  Mood and affect are appropriate.   Cranial nerves: no loss of smell or taste reported  Pupils are equal and briskly reactive to light. Funduscopic exam deferred. See Dr. Radene Ou notes.  Hearing was intact to soft voice and finger rubbing.  Facial sensation intact to fine touch. Facial motor strength is symmetric and tongue and uvula move midline.  Neck ROM : rotation, tilt and flexion extension were normal for age and shoulder shrug was symmetrical.    Motor exam:  Symmetric bulk, tone and ROM.   Normal tone without cog wheeling, symmetric grip strength .   Sensory:  Fine touch and vibration were absent at ankle, reduced at knees.    Coordination: Rapid alternating movements in the fingers/hands were of normal speed.  iDeep tendon reflexes: in the  upper extremities are symmetric and intact.  Right leg is missing the patella reflex, and achilles, has been radiculopathy L 4-5. Babinski response was deferred.        After spending a total time of 20 minutes face to face and additional time for physical and neurologic examination, review of laboratory studies,  personal review of imaging studies, reports and results of other testing and review of referral information / records as far as provided in visit, I have established the following assessments:   My Plan is to proceed with:  1) successfully controlled  apnea , continued use of CPAP- patient feels 'ok' and declined trazodone. He has a pacemaker but noted fear of exacerbating his bradycardia as concern.  User of a ResMEd , FFM, F 30. Mouth breather with chronic mouth dryness.    2) Nocturia can be further evaluated with urology. Apnea is not a contributing factor.    I would like to thank Dr Zadie Rhine, MD-  Hulan Fess, MD [place],  [place] [address] for allowing me to meet with and to take care of this pleasant patient.   In short, [name] is presenting with OSA on CPAP- I plan to follow up either personally or through our NP within 2-4 month.   CC: I will share my notes with PCP. Rv in 12 months.   Electronically signed by: Larey Seat, MD 04/27/2021 8:36 AM  [facility] Board certified by The [employer] of Sleep Medicine and Diplomate of the [employer] of Sleep Medicine. Board certified In Neurology through  the ABPN, Fellow of the [employer] of Neurology. Medical Director of [facility].

## 2021-05-06 DIAGNOSIS — I480 Paroxysmal atrial fibrillation: Secondary | ICD-10-CM | POA: Diagnosis not present

## 2021-05-06 DIAGNOSIS — Z7984 Long term (current) use of oral hypoglycemic drugs: Secondary | ICD-10-CM | POA: Diagnosis not present

## 2021-05-06 DIAGNOSIS — I2510 Atherosclerotic heart disease of native coronary artery without angina pectoris: Secondary | ICD-10-CM | POA: Diagnosis not present

## 2021-05-06 DIAGNOSIS — Z7982 Long term (current) use of aspirin: Secondary | ICD-10-CM | POA: Diagnosis not present

## 2021-05-06 DIAGNOSIS — E785 Hyperlipidemia, unspecified: Secondary | ICD-10-CM | POA: Diagnosis not present

## 2021-05-06 DIAGNOSIS — I10 Essential (primary) hypertension: Secondary | ICD-10-CM | POA: Diagnosis not present

## 2021-05-06 DIAGNOSIS — I25811 Atherosclerosis of native coronary artery of transplanted heart without angina pectoris: Secondary | ICD-10-CM | POA: Diagnosis not present

## 2021-05-06 DIAGNOSIS — Z7901 Long term (current) use of anticoagulants: Secondary | ICD-10-CM | POA: Diagnosis not present

## 2021-05-06 DIAGNOSIS — I498 Other specified cardiac arrhythmias: Secondary | ICD-10-CM | POA: Diagnosis not present

## 2021-05-06 DIAGNOSIS — Z79899 Other long term (current) drug therapy: Secondary | ICD-10-CM | POA: Diagnosis not present

## 2021-05-07 DIAGNOSIS — R002 Palpitations: Secondary | ICD-10-CM | POA: Diagnosis not present

## 2021-05-07 DIAGNOSIS — I480 Paroxysmal atrial fibrillation: Secondary | ICD-10-CM | POA: Diagnosis not present

## 2021-05-26 DIAGNOSIS — Z4509 Encounter for adjustment and management of other cardiac device: Secondary | ICD-10-CM | POA: Diagnosis not present

## 2021-05-26 DIAGNOSIS — I480 Paroxysmal atrial fibrillation: Secondary | ICD-10-CM | POA: Diagnosis not present

## 2021-06-18 DIAGNOSIS — K219 Gastro-esophageal reflux disease without esophagitis: Secondary | ICD-10-CM | POA: Diagnosis not present

## 2021-06-18 DIAGNOSIS — E118 Type 2 diabetes mellitus with unspecified complications: Secondary | ICD-10-CM | POA: Diagnosis not present

## 2021-06-18 DIAGNOSIS — D6869 Other thrombophilia: Secondary | ICD-10-CM | POA: Diagnosis not present

## 2021-06-18 DIAGNOSIS — H6123 Impacted cerumen, bilateral: Secondary | ICD-10-CM | POA: Diagnosis not present

## 2021-06-18 DIAGNOSIS — I480 Paroxysmal atrial fibrillation: Secondary | ICD-10-CM | POA: Diagnosis not present

## 2021-06-30 DIAGNOSIS — Z85828 Personal history of other malignant neoplasm of skin: Secondary | ICD-10-CM | POA: Diagnosis not present

## 2021-06-30 DIAGNOSIS — L814 Other melanin hyperpigmentation: Secondary | ICD-10-CM | POA: Diagnosis not present

## 2021-06-30 DIAGNOSIS — L821 Other seborrheic keratosis: Secondary | ICD-10-CM | POA: Diagnosis not present

## 2021-06-30 DIAGNOSIS — L819 Disorder of pigmentation, unspecified: Secondary | ICD-10-CM | POA: Diagnosis not present

## 2021-06-30 DIAGNOSIS — Z08 Encounter for follow-up examination after completed treatment for malignant neoplasm: Secondary | ICD-10-CM | POA: Diagnosis not present

## 2021-06-30 DIAGNOSIS — L728 Other follicular cysts of the skin and subcutaneous tissue: Secondary | ICD-10-CM | POA: Diagnosis not present

## 2021-06-30 DIAGNOSIS — D225 Melanocytic nevi of trunk: Secondary | ICD-10-CM | POA: Diagnosis not present

## 2021-06-30 DIAGNOSIS — L578 Other skin changes due to chronic exposure to nonionizing radiation: Secondary | ICD-10-CM | POA: Diagnosis not present

## 2021-06-30 DIAGNOSIS — L570 Actinic keratosis: Secondary | ICD-10-CM | POA: Diagnosis not present

## 2021-08-05 ENCOUNTER — Telehealth: Payer: Self-pay | Admitting: Neurology

## 2021-08-05 NOTE — Telephone Encounter (Signed)
Called the patient because we have a form that he dropped off that is a prescription for a oral appliance.  ?I called the pt to review this form. There was no mention in previous office notes about moving to a dental device for treatment.  ?Dr Vickey Huger reviewed the sleep study and due to severe sleep apnea dental device would not be best option.  ? ?Pt did not answer when I called. LVM asking for a call back to get more information on the purpose of this script.  ? ?If the pt is planning to continue using CPAP along with a dental device than that may be an option but Dr Vickey Huger would not recommend based off his apnea for him to dc cpap.  ?

## 2021-08-06 NOTE — Telephone Encounter (Signed)
Pt returned call and I was able to discuss his concerns related to the dental device. I advised that based off last SS with the severity of his sleep apnea Dr Brett Fairy doesn't feel that he would be treated effectively. Pt asked I faxed that information to the dentist. He was appreciative for the call back. ?

## 2021-08-25 DIAGNOSIS — I480 Paroxysmal atrial fibrillation: Secondary | ICD-10-CM | POA: Diagnosis not present

## 2021-08-25 DIAGNOSIS — Z45010 Encounter for checking and testing of cardiac pacemaker pulse generator [battery]: Secondary | ICD-10-CM | POA: Diagnosis not present

## 2021-08-30 DIAGNOSIS — I480 Paroxysmal atrial fibrillation: Secondary | ICD-10-CM | POA: Diagnosis not present

## 2021-08-30 DIAGNOSIS — Z45018 Encounter for adjustment and management of other part of cardiac pacemaker: Secondary | ICD-10-CM | POA: Diagnosis not present

## 2021-10-01 ENCOUNTER — Telehealth: Payer: Self-pay | Admitting: Neurology

## 2021-10-01 NOTE — Telephone Encounter (Signed)
[facility] should have the order on file from where previously sent in but I will forward this to them as a reminder to get the patient set up.  ?

## 2021-10-01 NOTE — Telephone Encounter (Signed)
Pt states he was told that he would not be Eligible for a new CPAP before the month of May.  Now that we are in the month of May Dr Dohmeier needs to submit a new order to [facility] for a CPAP for him ?

## 2021-10-08 DIAGNOSIS — G5622 Lesion of ulnar nerve, left upper limb: Secondary | ICD-10-CM | POA: Diagnosis not present

## 2021-10-08 DIAGNOSIS — M542 Cervicalgia: Secondary | ICD-10-CM | POA: Diagnosis not present

## 2021-10-12 DIAGNOSIS — H353132 Nonexudative age-related macular degeneration, bilateral, intermediate dry stage: Secondary | ICD-10-CM | POA: Diagnosis not present

## 2021-10-14 ENCOUNTER — Telehealth: Payer: Self-pay | Admitting: Neurology

## 2021-10-14 NOTE — Telephone Encounter (Signed)
Pt was scheduled for Initial CPAP visit on 01/11/22 Pt was informed to bring machine and power cord to appt. DME: [facility] Phone: [phone] Fax: [phone] Equipment issued: [name] 11 on 10/13/21 Pt to be scheduled between: 11/14/21-01/13/22

## 2022-01-06 NOTE — Patient Instructions (Addendum)
Please continue using your CPAP regularly. While your insurance requires that you use CPAP at least 4 hours each night on 70% of the nights, I recommend, that you not skip any nights and use it throughout the night if you can. Getting used to CPAP and staying with the treatment long term does take time and patience and discipline. Untreated obstructive sleep apnea when it is moderate to severe can have an adverse impact on cardiovascular health and raise her risk for heart disease, arrhythmias, hypertension, congestive heart failure, stroke and diabetes. Untreated obstructive sleep apnea causes sleep disruption, nonrestorative sleep, and sleep deprivation. This can have an impact on your day to day functioning and cause daytime sleepiness and impairment of cognitive function, memory loss, mood disturbance, and problems focussing. Using CPAP regularly can improve these symptoms.  We will continue to monitor apneic events over the next few months. Call me with any concerns.   Try trazodone 25mg  every night at bedtime. Give it about 2 weeks to determine effectiveness. If doing well, we can increase dose to 50mg  at night. If this medication makes you too groggy, please discontinue. You can try extended release melatonin if you can not tolerate trazodone.   Follow up in 6 months

## 2022-01-06 NOTE — Progress Notes (Signed)
PATIENT: [name] DOB: 07/24/1940  REASON FOR VISIT: follow up HISTORY FROM: patient  Chief Complaint  Patient presents with   Obstructive Sleep Apnea    Rm 16, w wife. Here for initial CPAP f/u. Pt reports doing well on CPAP. Pt reports having issues staying asleep every night. Waking up at 3 am and not able to fall asleep. Has been having to take either melatonin, Advil PM, or Tylenol PM.  Would like to try something to help him stay asleep.      HISTORY OF PRESENT ILLNESS:  01/11/22 ALL:  [name] is a 81 y.o. male here today for follow up for OSA on CPAP.  He was last seen by Dr Vickey Huger 04/2021 and had received replacement [employer] CPAP. He was doing well. He received a new Airsense 11 on 10/13/2021. He reports being very happy with his new machine. No difficulty with machine or supplies. He is using a traditional FFM. He does continue to have difficulty staying asleep. He usually goes to bed around 9-10 and wakes around 5. He does endorse nocturia. He is taking Advil PM or Tylenol PM as well as melatonin with some benefit.     HISTORY: (copied from Dr Dohmeier's previous note)  [name] is a 81 y.o. year old White or Caucasian male patient seen here in his RV on 04/27/2021 from  PCP at [facility]-  Dr Barton Fanny, MD. 04-27-2021:    Mr. Allen, last tested on 01-19-2021 HST.  AHI 48/ h. AHI sleeping on the right side was 78/h. 31 minutes of hypoxia.    "I sleep very well with it, but the nocturia still interrupts my sleep. Related to BPH. Epworth 5-24 points, keeping active." Compliance was 100% for days and 70% for hours,  4 h 49 min. Old CPAP was set at 16 cm water pressure and he got a new one - from [facility]. FFM. Residual AHI is  0.8/h. 95% pressure is 16 cm with 2 cm EPR.      Chief concern according to patient :  having used CPAP 5 years or longer, time for re-evaluation.    I have the pleasure of seeing [name] with a known OSA sleep  disorder. He was diagnosed with OSA at [facility] in [place] after he had unexplained high BP  readings about 13 years ago-  has a  has a past medical history of CAD (coronary artery disease), native coronary artery (06/2000), Diabetes mellitus type 2, noninsulin dependent (HCC), Dyslipidemia (high LDL; low HDL), Enlarged prostate, GERD (gastroesophageal reflux disease), H/O hiatal hernia, History of bronchitis, History of hematuria, Hypertension, uncontrolled (06/2000), Migraines, OSA on CPAP, bradycardia-  PAF (paroxysmal atrial fibrillation) (HCC), Presence of bare metal stent in LAD coronary artery (06/2000), Presence of permanent cardiac pacemaker, and S/P angioplasty with stent, placement of 2 overlapping DES Xience stents, to prox. Mid Lad and PTCA of distal LAD- 12/08/11 (12/09/2011).    Sleep relevant medical history: CPAP, Nocturia 1-2 times, BPH,  Family medical /sleep history: No siblings, son has tested negative-with OSA. 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 The patient currently is retired-  Tobacco use- none .  ETOH use rare ,  Caffeine intake in form of Coffee(  decaffeinated ) Soda( 1/ week )  Tea ( decaff) or energy drinks. Regular exercise in form of walk, yard work, farming.    Sleep habits are as follows: The patient's dinner time is between 5-7 PM. The patient goes to bed at 10 PM and continues to sleep for 5-6 hours, wakes for 5-8 bathroom breaks.   The preferred sleep position is sideways, but often in a recliner-  , with the support of 2 pillows.  Dreams are reportedly infrequent/vivid. History of GERD-  4-5  AM is the usual rise time. The patient wakes up spontaneously. He reports not feeling refreshed or restored in AM, with symptoms such as dry mouth, morning headaches, and residual fatigue. Naps are taken  frequently in a recliner , lasting from 5 to 60  minutes and are more refreshing than nocturnal sleep.    REVIEW OF SYSTEMS: Out of a complete 14 system review of symptoms, the patient complains only of the following symptoms, insomnia and all other reviewed systems are negative.  ESS: 9/24, previously 5/24, 10/24 prior to CPAP  ALLERGIES: Allergies  Allergen Reactions   Brilinta [Ticagrelor] Anaphylaxis   Dopamine Palpitations and Other (See Comments)    "years ago; shook 15 minutes after they stopped giving it to me"   Tetanus Toxoids Swelling    "Oraserum Tetanus"   Warfarin Sodium Other (See Comments)    Hematuria    Fish Oil Other (See Comments)    Hematuria     HOME MEDICATIONS: Outpatient Medications Prior to Visit  Medication Sig Dispense Refill   apixaban (ELIQUIS) 5 MG TABS tablet Take 5 mg by mouth 2 (two) times daily.     aspirin EC 81 MG tablet Take 81 mg by mouth daily.     atenolol (TENORMIN) 50 MG tablet Take 1 tablet by mouth 2 (two) times daily.     beta carotene w/minerals (OCUVITE) tablet Take 1 tablet by mouth daily.     Coenzyme Q10 (CO Q-10) 200 MG CAPS Take 1 tablet by mouth daily.     empagliflozin (JARDIANCE) 25 MG TABS tablet TAKE 1 TABLET BY MOUTH EVERY DAY     finasteride (PROSCAR) 5 MG tablet Take 1 tablet by mouth daily.     glipiZIDE (GLUCOTROL) 10 MG tablet Take 10 mg by mouth 2 (two) times daily before a meal.     melatonin 5 MG TABS Take 5 mg by mouth at bedtime as needed.     metFORMIN (GLUCOPHAGE) 500 MG tablet Take 1,000 mg by mouth 2 (two) times daily with a meal. Was on hold due to procedure     Multiple Vitamins-Minerals (PRESERVISION AREDS PO) Take 1 tablet by mouth daily.     omeprazole (PRILOSEC) 20 MG capsule Take 20 mg by mouth 2 (two) times daily before a meal.     rosuvastatin (CRESTOR) 20 MG tablet Take 20 mg by mouth daily.     sitaGLIPtin (JANUVIA) 100 MG tablet Take 100 mg by mouth daily.     telmisartan (MICARDIS) 80 MG tablet  Take 40 mg by mouth daily.     alfuzosin (UROXATRAL) 10 MG 24 hr tablet TAKE 1 TABLET BY MOUTH AT BEDTIME     No facility-administered medications prior to visit.    PAST MEDICAL HISTORY: Past Medical History:  Diagnosis Date   CAD (coronary artery disease), native coronary artery 06/2000   PCI to Proximal LAD 3.0 mm x 15 mm BMS   Diabetes mellitus type 2, noninsulin dependent (HCC)    complicated by CAD  Dyslipidemia (high LDL; low HDL)    Enlarged prostate    GERD (gastroesophageal reflux disease)    H/O hiatal hernia    History of bronchitis    "3 or 4 episodes over my lifetime" (12/08/11)   History of hematuria    no longer on warfarin   Hypertension, uncontrolled 06/2000   Migraines    "used to; not anymore"   OSA on CPAP    uses cpap   PAF (paroxysmal atrial fibrillation) (HCC)    s/p RF ablation; not on warfartin   Presence of bare metal stent in LAD coronary artery 06/2000   3.0 mm x 15 mm BMS   Presence of permanent cardiac pacemaker    S/P angioplasty with stent, placement of 2 overlapping DES Xience stents, to prox. Mid Lad and PTCA of distal LAD.  12/08/11 12/09/2011    PAST SURGICAL HISTORY: Past Surgical History:  Procedure Laterality Date   APPENDECTOMY  ~ 1976   BACK SURGERY     CARDIAC ELECTROPHYSIOLOGY STUDY AND ABLATION  ~ 2008   CATARACT EXTRACTION W/ INTRAOCULAR LENS  IMPLANT, BILATERAL  2012   CHOLECYSTECTOMY  ~ 1976   CORONARY ANGIOPLASTY WITH STENT PLACEMENT  06/2000   "1"   CORONARY ANGIOPLASTY WITH STENT PLACEMENT  12/08/11   "2 today; total of 3 now"   CYST EXCISION Left 10/13/2020   Procedure: LEFT INDEX FINGER EXCISION  MUCOID CYST AND DEBRIDEMENT DISTAL INTERPHALNGEAL JOINT AND POSSIBLE ROTATION FLAP;  Surgeon: Betha Loa, MD;  Location: [facility];  Service: Orthopedics;  Laterality: Left;   LEFT HEART CATHETERIZATION WITH CORONARY ANGIOGRAM N/A 12/08/2011   Procedure: LEFT HEART CATHETERIZATION WITH CORONARY ANGIOGRAM;  Surgeon:  Marykay Lex, MD;  Location[facility] C[facility] LAB;  Service: Cardiovascular;  Laterality: N/A;   LUMBAR DISC SURGERY  1996   "partial removal"   PERCUTANEOUS CORONARY STENT INTERVENTION (PCI-S)  12/08/2011   Procedure: PERCUTANEOUS CORONARY STENT INTERVENTION (PCI-S);  Surgeon: Marykay Lex, MD;  Location: [facility] CATH LAB;  Service: Cardiovascular;;   PROSTATE SURGERY  2005-2008   "several exploratory OR's to determine why I was bleeding; 3 or 4"   RENAL ANGIOGRAM N/A 12/08/2011   Procedure: RENAL ANGIOGRAM;  Surgeon: Marykay Lex, MD;  Location: [facility] CATH LAB;  Service: Cardiovascular;  Laterality: N/A;   TONSILLECTOMY AND ADENOIDECTOMY  1952    FAMILY HISTORY: Family History  Problem Relation Age of Onset   Alzheimer's disease Mother    Cancer Mother    Heart attack Father     SOCIAL HISTORY: Social History   Socioeconomic History   Marital status: Married    Spouse name: miina   Number of children: 3   Years of education: Not on file   Highest education level: Master's degree (e.g., MA, MS, MEng, MEd, MSW, MBA)  Occupational History   Not on file  Tobacco Use   Smoking status: Never   Smokeless tobacco: Never  Substance and Sexual Activity   Alcohol use: No   Drug use: No   Sexual activity: Yes  Other Topics Concern   Not on file  Social History Narrative   Live with wife   Right handed   Caffeine: no caffeine    Social Determinants of Health   Financial Resource Strain: Not on file  Food Insecurity: Not on file  Transportation Needs: Not on file  Physical Activity: Not on file  Stress: Not on file  Social Connections: Not on file  Intimate Partner Violence:  Not on file     PHYSICAL EXAM  Vitals:   01/11/22 0847  BP: (!) 150/75  Pulse: 63  Weight: 214 lb (97.1 kg)  Height: 5' 11.5" (1.816 m)   Body mass index is 29.43 kg/m.  Generalized: Well developed, in no acute distress  Cardiology: normal rate and rhythm, no murmur noted Respiratory: clear to  auscultation bilaterally  Neurological examination  Mentation: Alert oriented to time, place, history taking. Follows all commands speech and language fluent Cranial nerve II-XII: Pupils were equal round reactive to light. Extraocular movements were full, visual field were full  Motor: The motor testing reveals 5 over 5 strength of all 4 extremities. Good symmetric motor tone is noted throughout.  Gait and station: Gait is normal.    DIAGNOSTIC DATA (LABS, IMAGING, TESTING) - I reviewed patient records, labs, notes, testing and imaging myself where available.      No data to display           Lab Results  Component Value Date   WBC 6.6 12/12/2011   HGB 15.6 12/12/2011   HCT 46.0 12/12/2011   MCV 85.0 12/12/2011   PLT 182 12/12/2011      Component Value Date/Time   NA 139 10/08/2020 0930   K 4.5 10/08/2020 0930   CL 105 10/08/2020 0930   CO2 28 10/08/2020 0930   GLUCOSE 209 (H) 10/08/2020 0930   BUN 19 10/08/2020 0930   CREATININE 1.02 10/08/2020 0930   CALCIUM 9.3 10/08/2020 0930   PROT 7.1 06/18/2007 1245   ALBUMIN 4.2 06/18/2007 1245   AST 21 06/18/2007 1245   ALT 28 06/18/2007 1245   ALKPHOS 51 06/18/2007 1245   BILITOT 1.1 06/18/2007 1245   GFRNONAA >60 10/08/2020 0930   GFRAA >90 12/09/2011 0532   No results found for: "CHOL", "HDL", "LDLCALC", "LDLDIRECT", "TRIG", "CHOLHDL" No results found for: "HGBA1C" No results found for: "VITAMINB12"    ASSESSMENT AND PLAN 81 y.o. year old male  has a past medical history of CAD (coronary artery disease), native coronary artery (06/2000), Diabetes mellitus type 2, noninsulin dependent (HCC), Dyslipidemia (high LDL; low HDL), Enlarged prostate, GERD (gastroesophageal reflux disease), H/O hiatal hernia, History of bronchitis, History of hematuria, Hypertension, uncontrolled (06/2000), Migraines, OSA on CPAP, PAF (paroxysmal atrial fibrillation) (HCC), Presence of bare metal stent in LAD coronary artery (06/2000), Presence of  permanent cardiac pacemaker, and S/P angioplasty with stent, placement of 2 overlapping DES Xience stents, to prox. Mid Lad and PTCA of distal LAD.  12/08/11 (12/09/2011). here with     ICD-10-CM   1. OSA on CPAP  G47.33    Z99.89        [place] A Hollenbach is doing well on CPAP therapy. Compliance report reveals excellent compliance. AHI is 6/hr. We will monitor this closely. Previously 0.8/hr on [employer] machine. Pressure settings look good, no significant air leak. We will try low dose trazodone 25mg  QHS. May increase to 50mg  QHS if well tolerated. He was encouraged to continue using CPAP nightly and for greater than 4 hours each night. We will update supply orders as indicated. Risks of untreated sleep apnea review and education materials provided. Healthy lifestyle habits encouraged. He will follow up in 6 months, sooner if needed. He verbalizes understanding and agreement with this plan.    No orders of the defined types were placed in this encounter.    Meds ordered this encounter  Medications   traZODone (DESYREL) 50 MG tablet    Sig: Take  0.5 tablets (25 mg total) by mouth at bedtime.    Dispense:  45 tablet    Refill:  1    Order Specific Question:   Supervising Provider    Answer:   Bernestine Amass, FNP-C 01/11/2022, 9:56 AM [facility] [address] [place], [place] [address] [phone]

## 2022-01-11 ENCOUNTER — Ambulatory Visit: Payer: Medicare PPO | Admitting: Family Medicine

## 2022-01-11 ENCOUNTER — Other Ambulatory Visit: Payer: Self-pay | Admitting: Rehabilitation

## 2022-01-11 ENCOUNTER — Encounter: Payer: Self-pay | Admitting: Family Medicine

## 2022-01-11 VITALS — BP 150/75 | HR 63 | Ht 71.5 in | Wt 214.0 lb

## 2022-01-11 DIAGNOSIS — G4733 Obstructive sleep apnea (adult) (pediatric): Secondary | ICD-10-CM

## 2022-01-11 DIAGNOSIS — Z9989 Dependence on other enabling machines and devices: Secondary | ICD-10-CM | POA: Diagnosis not present

## 2022-01-11 DIAGNOSIS — S32010A Wedge compression fracture of first lumbar vertebra, initial encounter for closed fracture: Secondary | ICD-10-CM

## 2022-01-11 MED ORDER — TRAZODONE HCL 50 MG PO TABS: 25.0000 mg | ORAL_TABLET | Freq: Every day | ORAL | 1 refills | Status: DC

## 2022-01-15 ENCOUNTER — Ambulatory Visit
Admission: RE | Admit: 2022-01-15 | Discharge: 2022-01-15 | Disposition: A | Discharge status: Home / Self Care | Payer: Medicare PPO | Source: Ambulatory Visit | Attending: Rehabilitation | Admitting: Rehabilitation

## 2022-01-15 DIAGNOSIS — S32010A Wedge compression fracture of first lumbar vertebra, initial encounter for closed fracture: Secondary | ICD-10-CM

## 2022-01-18 ENCOUNTER — Emergency Department (HOSPITAL_COMMUNITY)
Admission: EM | Admit: 2022-01-18 | Discharge: 2022-01-18 | Disposition: A | Discharge status: Home / Self Care | Payer: Medicare PPO | Attending: Emergency Medicine | Admitting: Emergency Medicine

## 2022-01-18 ENCOUNTER — Other Ambulatory Visit: Payer: Self-pay

## 2022-01-18 ENCOUNTER — Encounter (HOSPITAL_COMMUNITY): Payer: Self-pay

## 2022-01-18 ENCOUNTER — Emergency Department (HOSPITAL_COMMUNITY): Payer: Medicare PPO

## 2022-01-18 DIAGNOSIS — S0083XA Contusion of other part of head, initial encounter: Secondary | ICD-10-CM | POA: Diagnosis not present

## 2022-01-18 DIAGNOSIS — I10 Essential (primary) hypertension: Secondary | ICD-10-CM | POA: Diagnosis not present

## 2022-01-18 DIAGNOSIS — S0990XA Unspecified injury of head, initial encounter: Secondary | ICD-10-CM | POA: Diagnosis present

## 2022-01-18 DIAGNOSIS — I480 Paroxysmal atrial fibrillation: Secondary | ICD-10-CM | POA: Insufficient documentation

## 2022-01-18 DIAGNOSIS — W19XXXA Unspecified fall, initial encounter: Secondary | ICD-10-CM

## 2022-01-18 DIAGNOSIS — Z7901 Long term (current) use of anticoagulants: Secondary | ICD-10-CM | POA: Diagnosis not present

## 2022-01-18 DIAGNOSIS — Z7982 Long term (current) use of aspirin: Secondary | ICD-10-CM | POA: Diagnosis not present

## 2022-01-18 DIAGNOSIS — E119 Type 2 diabetes mellitus without complications: Secondary | ICD-10-CM | POA: Insufficient documentation

## 2022-01-18 DIAGNOSIS — W01198A Fall on same level from slipping, tripping and stumbling with subsequent striking against other object, initial encounter: Secondary | ICD-10-CM | POA: Insufficient documentation

## 2022-01-18 DIAGNOSIS — I2510 Atherosclerotic heart disease of native coronary artery without angina pectoris: Secondary | ICD-10-CM | POA: Insufficient documentation

## 2022-01-18 LAB — CBC WITH DIFFERENTIAL/PLATELET
Abs Immature Granulocytes: 0.03 10*3/uL (ref 0.00–0.07)
Basophils Absolute: 0 10*3/uL (ref 0.0–0.1)
Basophils Relative: 1 %
Eosinophils Absolute: 0.2 10*3/uL (ref 0.0–0.5)
Eosinophils Relative: 2 %
HCT: 46.2 % (ref 39.0–52.0)
Hemoglobin: 15.3 g/dL (ref 13.0–17.0)
Immature Granulocytes: 0 %
Lymphocytes Relative: 20 %
Lymphs Abs: 1.5 10*3/uL (ref 0.7–4.0)
MCH: 28.2 pg (ref 26.0–34.0)
MCHC: 33.1 g/dL (ref 30.0–36.0)
MCV: 85.2 fL (ref 80.0–100.0)
Monocytes Absolute: 0.5 10*3/uL (ref 0.1–1.0)
Monocytes Relative: 6 %
Neutro Abs: 5.1 10*3/uL (ref 1.7–7.7)
Neutrophils Relative %: 71 %
Platelets: 240 10*3/uL (ref 150–400)
RBC: 5.42 MIL/uL (ref 4.22–5.81)
RDW: 14.5 % (ref 11.5–15.5)
WBC: 7.3 10*3/uL (ref 4.0–10.5)
nRBC: 0 % (ref 0.0–0.2)

## 2022-01-18 LAB — COMPREHENSIVE METABOLIC PANEL
ALT: 19 U/L (ref 0–44)
AST: 18 U/L (ref 15–41)
Albumin: 4 g/dL (ref 3.5–5.0)
Alkaline Phosphatase: 83 U/L (ref 38–126)
Anion gap: 8 (ref 5–15)
BUN: 14 mg/dL (ref 8–23)
CO2: 25 mmol/L (ref 22–32)
Calcium: 9.6 mg/dL (ref 8.9–10.3)
Chloride: 106 mmol/L (ref 98–111)
Creatinine, Ser: 1.1 mg/dL (ref 0.61–1.24)
GFR, Estimated: >60 mL/min (ref >60)
Glucose, Bld: 288 mg/dL — ABNORMAL HIGH (ref 70–99)
Potassium: 4.8 mmol/L (ref 3.5–5.1)
Sodium: 139 mmol/L (ref 135–145)
Total Bilirubin: 0.7 mg/dL (ref 0.3–1.2)
Total Protein: 7.5 g/dL (ref 6.5–8.1)

## 2022-01-18 LAB — PROTIME-INR
INR: 1.2 (ref 0.8–1.2)
Prothrombin Time: 15.4 seconds — ABNORMAL HIGH (ref 11.4–15.2)

## 2022-01-18 LAB — APTT: aPTT: 35 seconds (ref 24–36)

## 2022-01-18 LAB — CBG MONITORING, ED: Glucose-Capillary: 301 mg/dL — ABNORMAL HIGH (ref 70–99)

## 2022-01-18 NOTE — ED Provider Triage Note (Signed)
Emergency Medicine Provider Triage Evaluation Note  [name] , a 81 y.o. male  was evaluated in triage.  Pt complains of fall onset 2 weeks ago.  Patient notes that he has a procedure for his back due to a vertebral fracture.  Has been off of his Eliquis for the past 4 days.  Notes that he was on Eliquis at the time of the fall.  Has associated dizziness.  Denies headache, blurred vision, double vision, nausea, vomiting.  Review of Systems  Positive: As per HPI Negative:   Physical Exam  BP (!) 166/69 (BP Location: Left Arm)   Pulse 60   Temp 98.5 F (36.9 C) (Oral)   Resp 18   Ht 5\' 11"  (1.803 m)   Wt 97.1 kg   SpO2 97%   BMI 29.85 kg/m  Gen:   Awake, no distress   Resp:  Normal effort  MSK:   Moves extremities without difficulty  Other:  Negative pronator drift. No FND.   Medical Decision Making  Medically screening exam initiated at 2:28 PM.  Appropriate orders placed.  [name] was informed that the remainder of the evaluation will be completed by another provider, this initial triage assessment does not replace that evaluation, and the importance of remaining in the ED until their evaluation is complete.  2:36 PM - Discussed with RN that patient is in need of a room immediately. RN aware and working on room placement. Work-up initiated.    [name] A, PA-C 01/18/22 1436

## 2022-01-18 NOTE — Discharge Instructions (Addendum)
Based on the events which brought you to the ER today, it is possible that you may have a concussion. A concussion occurs when there is a blow to the head or body, with enough force to shake the brain and disrupt how the brain functions. You may experience symptoms such as headaches, sensitivity to light/noise, dizziness, cognitive slowing, difficulty concentrating / remembering, trouble sleeping and drowsiness. These symptoms may last anywhere from hours/days to potentially weeks/months. While these symptoms are very frustrating and perhaps debilitating, it is important that you remember that they will improve over time. Everyone has a different rate of recovery; it is difficult to predict when your symptoms will resolve. In order to allow for your brain to heal after the injury, we recommend that you see your primary physician or a physician knowledgeable in concussion management. We also advise you to let your body and brain rest: avoid physical activities (sports, gym, and exercise) and reduce cognitive demands (reading, texting, TV watching, computer use, video games, etc). School attendance, after-school activities and work may need to be modified to avoid increasing symptoms. We recommend against driving until until all symptoms have resolved. You should take 650mg of Acetaminophen (Tylenol) every 4 hours as needed for pain control; however, taking anti-inflammatory medication (Motrin/Advil/Ibuprofen) is not advised. Come back to the ER right away if you are having repeated episodes of vomiting, severe/worsening headache/dizziness or any other symptom that alarms you. We recommended that someone stay with you for the next 24 hours to monitor for these worrisome symptoms.  It was a pleasure caring for you today in the emergency department.  Please return to the emergency department for any worsening or worrisome symptoms.  

## 2022-01-18 NOTE — ED Provider Notes (Signed)
[facility] EMERGENCY DEPARTMENT Provider Note   CSN: [id number] Arrival date & time: 01/18/22  1340     History  Chief Complaint  Patient presents with   [name] is a 81 y.o. male.  Patient as above with significant medical history as below, including CAD, DM 2, HLD, HTN, OSA on CPAP, PAF on Eliquis who presents to the ED with complaint of fall 2 weeks ago with head injury.  Patient reports he was leaning over to investigate an abnormality to his lawnmower, he stood up quickly, felt lightheaded.  Larey Seat to the ground.  Struck his posterior portion of his head and his low back on the ground.  No LOC.  No chest pain or dyspnea prior to episode.  He went to go sit in his pickup truck and felt somewhat nauseated, this was transient and lasted a few moments.  He has had persistent low back pain, he was seen by back specialist recently scheduled for spine surgery on Wednesday secondary to fracture (L1 compression).  He has had intermittent headaches over the past 2 weeks, no numbness or tingling, no further nausea, no vomiting.  No chest pain or dyspnea.  No discomfort with neck movement.  Has been off of his Eliquis Friday in preparation for his surgery. No rhinorrhea or otorrhea.     Past Medical History:  Diagnosis Date   CAD (coronary artery disease), native coronary artery 06/2000   PCI to Proximal LAD 3.0 mm x 15 mm BMS   Diabetes mellitus type 2, noninsulin dependent (HCC)    complicated by CAD   Dyslipidemia (high LDL; low HDL)    Enlarged prostate    GERD (gastroesophageal reflux disease)    H/O hiatal hernia    History of bronchitis    "3 or 4 episodes over my lifetime" (12/08/11)   History of hematuria    no longer on warfarin   Hypertension, uncontrolled 06/2000   Migraines    "used to; not anymore"   OSA on CPAP    uses cpap   PAF (paroxysmal atrial fibrillation) (HCC)    s/p RF ablation; not on warfartin   Presence of bare metal stent in  LAD coronary artery 06/2000   3.0 mm x 15 mm BMS   Presence of permanent cardiac pacemaker    S/P angioplasty with stent, placement of 2 overlapping DES Xience stents, to prox. Mid Lad and PTCA of distal LAD.  12/08/11 12/09/2011    Past Surgical History:  Procedure Laterality Date   APPENDECTOMY  ~ 1976   BACK SURGERY     CARDIAC ELECTROPHYSIOLOGY STUDY AND ABLATION  ~ 2008   CATARACT EXTRACTION W/ INTRAOCULAR LENS  IMPLANT, BILATERAL  2012   CHOLECYSTECTOMY  ~ 1976   CORONARY ANGIOPLASTY WITH STENT PLACEMENT  06/2000   "1"   CORONARY ANGIOPLASTY WITH STENT PLACEMENT  12/08/11   "2 today; total of 3 now"   CYST EXCISION Left 10/13/2020   Procedure: LEFT INDEX FINGER EXCISION  MUCOID CYST AND DEBRIDEMENT DISTAL INTERPHALNGEAL JOINT AND POSSIBLE ROTATION FLAP;  Surgeon: Betha Loa, MD;  Location: [facility];  Service: Orthopedics;  Laterality: Left;   LEFT HEART CATHETERIZATION WITH CORONARY ANGIOGRAM N/A 12/08/2011   Procedure: LEFT HEART CATHETERIZATION WITH CORONARY ANGIOGRAM;  Surgeon: Marykay Lex, MD;  Location: [facility] C[facility] LAB;  Service: Cardiovascular;  Laterality: N/A;   LUMBAR DISC SURGERY  1996   "partial removal"   PERCUTANEOUS CORONARY STENT  INTERVENTION (PCI-S)  12/08/2011   Procedure: PERCUTANEOUS CORONARY STENT INTERVENTION (PCI-S);  Surgeon: Leonie Man, MD;  Location: [facility] CATH LAB;  Service: Cardiovascular;;   PROSTATE SURGERY  2005-2008   "several exploratory OR's to determine why I was bleeding; 3 or 4"   RENAL ANGIOGRAM N/A 12/08/2011   Procedure: RENAL ANGIOGRAM;  Surgeon: Leonie Man, MD;  Location: [facility] CATH LAB;  Service: Cardiovascular;  Laterality: N/A;   [place]     The history is provided by the patient and the spouse. No language interpreter was used.  Fall Associated symptoms include headaches. Pertinent negatives include no chest pain, no abdominal pain and no shortness of breath.       Home  Medications Prior to Admission medications   Medication Sig Start Date End Date Taking? Authorizing Provider  apixaban (ELIQUIS) 5 MG TABS tablet Take 5 mg by mouth 2 (two) times daily.    [provider]  aspirin EC 81 MG tablet Take 81 mg by mouth daily.    [provider]  atenolol (TENORMIN) 50 MG tablet Take 1 tablet by mouth 2 (two) times daily. 02/15/11   [provider]  beta carotene w/minerals (OCUVITE) tablet Take 1 tablet by mouth daily.    [provider]  Coenzyme Q10 (CO Q-10) 200 MG CAPS Take 1 tablet by mouth daily.    [provider]  empagliflozin (JARDIANCE) 25 MG TABS tablet TAKE 1 TABLET BY MOUTH EVERY DAY 09/26/17   [provider]  finasteride (PROSCAR) 5 MG tablet Take 1 tablet by mouth daily. 08/09/20   [provider]  glipiZIDE (GLUCOTROL) 10 MG tablet Take 10 mg by mouth 2 (two) times daily before a meal.    [provider]  melatonin 5 MG TABS Take 5 mg by mouth at bedtime as needed.    [provider]  metFORMIN (GLUCOPHAGE) 500 MG tablet Take 1,000 mg by mouth 2 (two) times daily with a meal. Was on hold due to procedure    [provider]  Multiple Vitamins-Minerals (PRESERVISION AREDS PO) Take 1 tablet by mouth daily.    [provider]  omeprazole (PRILOSEC) 20 MG capsule Take 20 mg by mouth 2 (two) times daily before a meal.    [provider]  rosuvastatin (CRESTOR) 20 MG tablet Take 20 mg by mouth daily.    [provider]  sitaGLIPtin (JANUVIA) 100 MG tablet Take 100 mg by mouth daily.    [provider]  telmisartan (MICARDIS) 80 MG tablet Take 40 mg by mouth daily.    [provider]  traZODone (DESYREL) 50 MG tablet Take 0.5 tablets (25 mg total) by mouth at bedtime. 01/11/22   Lomax, Amy, NP      Allergies    Brilinta [ticagrelor], Dopamine, Tetanus toxoids, Warfarin sodium, and Fish oil    Review of Systems   Review of  Systems  Constitutional:  Negative for chills and fever.  HENT:  Negative for facial swelling and trouble swallowing.   Eyes:  Negative for photophobia and visual disturbance.  Respiratory:  Negative for cough and shortness of breath.   Cardiovascular:  Negative for chest pain and palpitations.  Gastrointestinal:  Positive for nausea. Negative for abdominal pain and vomiting.  Endocrine: Negative for polydipsia and polyuria.  Genitourinary:  Negative for difficulty urinating and hematuria.  Musculoskeletal:  Positive for back pain. Negative for gait problem and joint swelling.  Skin:  Negative for pallor  and rash.  Neurological:  Positive for light-headedness and headaches. Negative for syncope.  Psychiatric/Behavioral:  Negative for agitation and confusion.     Physical Exam Updated Vital Signs BP (!) 166/69 (BP Location: Left Arm)   Pulse 60   Temp 98.5 F (36.9 C) (Oral)   Resp 18   Ht 5\' 11"  (1.803 m)   Wt 97.1 kg   SpO2 97%   BMI 29.85 kg/m  Physical Exam Vitals and nursing note reviewed.  Constitutional:      General: He is not in acute distress.    Appearance: He is well-developed.  HENT:     Head: Normocephalic. Battle's sign present. No raccoon eyes, right periorbital erythema or left periorbital erythema.     Jaw: There is normal jaw occlusion.     Comments: Small hematoma left occipital, left battle sign, no hemotympanum    Right Ear: External ear normal.     Left Ear: External ear normal.     Mouth/Throat:     Mouth: Mucous membranes are moist.  Eyes:     General: No scleral icterus.    Extraocular Movements: Extraocular movements intact.     Pupils: Pupils are equal, round, and reactive to light.  Cardiovascular:     Rate and Rhythm: Normal rate and regular rhythm.     Pulses: Normal pulses.     Heart sounds: Normal heart sounds.  Pulmonary:     Effort: Pulmonary effort is normal. No respiratory distress.     Breath sounds: Normal breath sounds.   Abdominal:     General: Abdomen is flat.     Palpations: Abdomen is soft.     Tenderness: There is no abdominal tenderness.  Musculoskeletal:        General: Normal range of motion.     Cervical back: Normal range of motion.     Right lower leg: No edema.     Left lower leg: No edema.  Skin:    General: Skin is warm and dry.     Capillary Refill: Capillary refill takes less than 2 seconds.  Neurological:     Mental Status: He is alert and oriented to person, place, and time.     GCS: GCS eye subscore is 4. GCS verbal subscore is 5. GCS motor subscore is 6.     Cranial Nerves: Cranial nerves 2-12 are intact. No dysarthria or facial asymmetry.     Sensory: Sensation is intact.     Motor: Motor function is intact. No weakness or tremor.     Coordination: Coordination is intact. Coordination normal.     Gait: Gait is intact.  Psychiatric:        Mood and Affect: Mood normal.        Behavior: Behavior normal.     ED Results / Procedures / Treatments   Labs (all labs ordered are listed, but only abnormal results are displayed) Labs Reviewed  COMPREHENSIVE METABOLIC PANEL - Abnormal; Notable for the following components:      Result Value   Glucose, Bld 288 (*)    All other components within normal limits  PROTIME-INR - Abnormal; Notable for the following components:   Prothrombin Time 15.4 (*)    All other components within normal limits  CBG MONITORING, ED - Abnormal; Notable for the following components:   Glucose-Capillary 301 (*)    All other components within normal limits  CBC WITH DIFFERENTIAL/PLATELET  APTT  URINALYSIS, ROUTINE W REFLEX MICROSCOPIC    EKG None  Radiology CT Cervical Spine Wo Contrast  Result Date: 01/18/2022 CLINICAL DATA:  Neck trauma. EXAM: CT CERVICAL SPINE WITHOUT CONTRAST TECHNIQUE: Multidetector CT imaging of the cervical spine was performed without intravenous contrast. Multiplanar CT image reconstructions were also generated. RADIATION  DOSE REDUCTION: This exam was performed according to the departmental dose-optimization program which includes automated exposure control, adjustment of the mA and/or kV according to patient size and/or use of iterative reconstruction technique. COMPARISON:  None Available. FINDINGS: Alignment: Normal. Skull base and vertebrae: No acute fracture. No primary bone lesion or focal pathologic process. Soft tissues and spinal canal: No prevertebral fluid or swelling. No visible canal hematoma. Disc levels: There is disc space narrowing and endplate osteophyte formation throughout the cervical spine. There is severe neural foraminal stenosis on the left at C3-C4 and C6-C7 secondary to facet arthropathy and uncovertebral spurring. There is severe neural foraminal stenosis on the right at C5-C6 secondary to uncovertebral spurring. Disc osteophyte complexes are seen at C4-C5, C5-C6 and C6-C7 causing mild central canal stenosis. Upper chest: Negative. Other: None. IMPRESSION: 1. No acute fracture or traumatic subluxation of the cervical spine. 2.  Moderate multilevel degenerative changes of the cervical spine. Electronically Signed   By: Ronney Asters M.D.   On: 01/18/2022 15:54   CT Head Wo Contrast  Result Date: 01/18/2022 CLINICAL DATA:  Fall 2 weeks ago, hit back of head on concrete, persistent dizziness EXAM: CT HEAD WITHOUT CONTRAST TECHNIQUE: Contiguous axial images were obtained from the base of the skull through the vertex without intravenous contrast. RADIATION DOSE REDUCTION: This exam was performed according to the departmental dose-optimization program which includes automated exposure control, adjustment of the mA and/or kV according to patient size and/or use of iterative reconstruction technique. COMPARISON:  None Available. FINDINGS: Brain: No evidence of acute infarction, hemorrhage, hydrocephalus, extra-axial collection or mass lesion/mass effect. Periventricular and deep white matter hypodensity.  Vascular: No hyperdense vessel or unexpected calcification. Skull: Normal. Negative for fracture or focal lesion. Sinuses/Orbits: No acute finding. Other: Soft tissue contusion of the posterior left scalp vertex (series 3, image 21). IMPRESSION: 1. No acute intracranial pathology. Small-vessel white matter disease. 2. Soft tissue contusion of the posterior left scalp vertex. Electronically Signed   By: Delanna Ahmadi M.D.   On: 01/18/2022 15:47    Procedures Procedures    Medications Ordered in ED Medications - No data to display  ED Course/ Medical Decision Making/ A&P Clinical Course as of 01/18/22 1841  Mon Jan 18, 2022  1824 Spoke with radiology regarding CT, no evidence of fx, there is a hematoma left but no fracture, sinuses normal.  [SG]    Clinical Course User Index [SG] Jeanell Sparrow, DO                           Medical Decision Making  This patient presents to the ED with chief complaint(s) of fall w/ head injury on DOAC with pertinent past medical history of doac use which further complicates the presenting complaint. The complaint involves an extensive differential diagnosis and also carries with it a high risk of complications and morbidity.    Differential diagnoses for head trauma includes subdural hematoma, epidural hematoma, acute concussion, traumatic subarachnoid hemorrhage, cerebral contusions, etc.  . Serious etiologies were considered.   The initial plan is to screening labs and imaging ordered in triage    Additional history obtained: Additional history obtained from spouse Records reviewed Primary Care Documents  home meds, prior labs imaging   Independent labs interpretation:  The following labs were independently interpreted:  Glucose mildly elevated, no evidence of DKA no AG. Labs otherwise stable  Independent visualization of imaging: - I independently visualized the following imaging with scope of interpretation limited to determining acute life  threatening conditions related to emergency care: CTH, CT cervical spine, which revealed no acute injury  Cardiac monitoring was reviewed and interpreted by myself which shows n/a   Consultation: - Consulted or discussed management/test interpretation w/ external professional: radiology given battle sign to the left, he has reviewed imaging and agrees no evidence of fx/skull fx. Given neg imaging, normal neuro exam, no evidence of csf otorrhea/rhinorrhea, my suspicion for skull fx is  very low currently.   Consideration for admission or further workup: Admission was considered    Patient presents with low mechanism head trauma. On initial evaluation patient appears in no acute distress, afebrile with normal vital signs. Patient has completely intact neurovascular exam, and symptoms  improved in ED.   Pt with fall/head injury 2 wks ago, neurologically intact; he is ambulatory w/ steady gait, no focal deficits on exam. I am suspicion for possible concussion. Appears stable at this time.    Discussed possible etiology of concussion, signs and symptoms, and discharge instructions. Detailed discussions were had with the patient regarding current findings, and need for close f/u with PCP or on call doctor. The patient has been instructed to return immediately if the symptoms worsen in any way for re-evaluation. Patient verbalized understanding and is in agreement with current care plan. All questions answered prior to discharge  Social Determinants of health: Social History   Tobacco Use   Smoking status: Never   Smokeless tobacco: Never  Substance Use Topics   Alcohol use: No   Drug use: No            Final Clinical Impression(s) / ED Diagnoses Final diagnoses:  Injury of head, initial encounter  Fall, initial encounter    Rx / DC Orders ED Discharge Orders     None         Sloan Leiter, DO 01/18/22 1841

## 2022-01-18 NOTE — ED Triage Notes (Signed)
Patient fell two weeks ago landing on back and hitting back of head on concrete.  Was not seen for it but did get seen for back pain with a vert fx.  Patient has knot on back of head but reports he has just been dizzy and not feeling right since the fall. Patient is on eliquis.

## 2022-03-21 ENCOUNTER — Emergency Department (HOSPITAL_COMMUNITY): Payer: Medicare PPO

## 2022-03-21 ENCOUNTER — Encounter (HOSPITAL_COMMUNITY): Payer: Self-pay | Admitting: Emergency Medicine

## 2022-03-21 ENCOUNTER — Emergency Department (HOSPITAL_COMMUNITY)
Admission: EM | Admit: 2022-03-21 | Discharge: 2022-03-21 | Disposition: A | Discharge status: Home / Self Care | Payer: Medicare PPO | Attending: Emergency Medicine | Admitting: Emergency Medicine

## 2022-03-21 DIAGNOSIS — Z7901 Long term (current) use of anticoagulants: Secondary | ICD-10-CM | POA: Insufficient documentation

## 2022-03-21 DIAGNOSIS — T82111A Breakdown (mechanical) of cardiac pulse generator (battery), initial encounter: Secondary | ICD-10-CM

## 2022-03-21 DIAGNOSIS — R531 Weakness: Secondary | ICD-10-CM | POA: Diagnosis present

## 2022-03-21 DIAGNOSIS — Z950 Presence of cardiac pacemaker: Secondary | ICD-10-CM | POA: Insufficient documentation

## 2022-03-21 DIAGNOSIS — I2510 Atherosclerotic heart disease of native coronary artery without angina pectoris: Secondary | ICD-10-CM | POA: Diagnosis not present

## 2022-03-21 DIAGNOSIS — E119 Type 2 diabetes mellitus without complications: Secondary | ICD-10-CM | POA: Diagnosis not present

## 2022-03-21 DIAGNOSIS — Z7984 Long term (current) use of oral hypoglycemic drugs: Secondary | ICD-10-CM | POA: Insufficient documentation

## 2022-03-21 DIAGNOSIS — I480 Paroxysmal atrial fibrillation: Secondary | ICD-10-CM | POA: Insufficient documentation

## 2022-03-21 DIAGNOSIS — Z7982 Long term (current) use of aspirin: Secondary | ICD-10-CM | POA: Diagnosis not present

## 2022-03-21 LAB — BASIC METABOLIC PANEL
Anion gap: 11 (ref 5–15)
BUN: 14 mg/dL (ref 8–23)
CO2: 23 mmol/L (ref 22–32)
Calcium: 9.4 mg/dL (ref 8.9–10.3)
Chloride: 104 mmol/L (ref 98–111)
Creatinine, Ser: 0.94 mg/dL (ref 0.61–1.24)
GFR, Estimated: >60 mL/min (ref >60)
Glucose, Bld: 260 mg/dL — ABNORMAL HIGH (ref 70–99)
Potassium: 4.1 mmol/L (ref 3.5–5.1)
Sodium: 138 mmol/L (ref 135–145)

## 2022-03-21 LAB — TROPONIN I (HIGH SENSITIVITY)
Troponin I (High Sensitivity): 7 ng/L (ref <18)
Troponin I (High Sensitivity): 8 ng/L (ref <18)

## 2022-03-21 LAB — CBC
HCT: 47.9 % (ref 39.0–52.0)
Hemoglobin: 15.6 g/dL (ref 13.0–17.0)
MCH: 27.5 pg (ref 26.0–34.0)
MCHC: 32.6 g/dL (ref 30.0–36.0)
MCV: 84.3 fL (ref 80.0–100.0)
Platelets: 218 10*3/uL (ref 150–400)
RBC: 5.68 MIL/uL (ref 4.22–5.81)
RDW: 13.9 % (ref 11.5–15.5)
WBC: 6.7 10*3/uL (ref 4.0–10.5)
nRBC: 0 % (ref 0.0–0.2)

## 2022-03-21 NOTE — Discharge Instructions (Addendum)
You have been seen today for your complaint of pacemaker malfunction. Your lab work was reassuring and showed no abnormalities. Your imaging was reassuring and showed no abnormalities. Follow up with: Your primary cardiologist within the next 3 days Please seek immediate medical care if you develop any of the following symptoms: You have pain in your chest, neck, arm, jaw, or back, and the pain: Lasts more than a few minutes. Is recurring. Is not relieved by taking medicines under the tongue (sublingual nitroglycerin). Increases in intensity or frequency. You have a lot of sweating without cause. You have unexplained: Heartburn or indigestion. Shortness of breath or difficulty breathing. Nausea or vomiting. Fatigue. Feelings of nervousness or anxiety. Weakness. You have sudden light-headedness or dizziness. You faint. At this time there does not appear to be the presence of an emergent medical condition, however there is always the potential for conditions to change. Please read and follow the below instructions.  Do not take your medicine if  develop an itchy rash, swelling in your mouth or lips, or difficulty breathing; call 911 and seek immediate emergency medical attention if this occurs.  You may review your lab tests and imaging results in their entirety on your MyChart account.  Please discuss all results of fully with your primary care provider and other specialist at your follow-up visit.  Note: Portions of this text may have been transcribed using voice recognition software. Every effort was made to ensure accuracy; however, inadvertent computerized transcription errors may still be present.

## 2022-03-21 NOTE — ED Provider Notes (Signed)
[place] EMERGENCY DEPARTMENT Provider Note   CSN: [id number] Arrival date & time: 03/21/22  0820     History  Chief Complaint  Patient presents with   Weakness    [name] is a 81 y.o. male. With a history of A fib with eliquis anticoagulation and implantable pacemaker, cardiac ablation, CAD, DMII who presents to the ED via EMS for evaluation of pacemaker malfunction.  He states he was sitting at his table drinking coffee this morning when he felt "off" so he checked his pulse with a home pulse oximeter and noticed that the rate was approximately 40 bpm.  Due to his cardiac history he felt like he should call EMS.  He did this and they dispatched an ambulance.  He states that he began to feel better prior to ambulance arrival so he called and tried to cancel EMS, however they were already at his door.  He was advised to come to the ED for further workup and agreed. He has been asymptomatic since prior to ambulance arrival. Specifically denies chest pain, shortness of breath, dizziness, lightheadedness, numbness, weakness, tingling, cough.  Patient sees cardiology at [facility]. He is paced at 60 bpm.  Weakness      Home Medications Prior to Admission medications   Medication Sig Start Date End Date Taking? Authorizing Provider  apixaban (ELIQUIS) 5 MG TABS tablet Take 5 mg by mouth 2 (two) times daily.    [provider]  aspirin EC 81 MG tablet Take 81 mg by mouth daily.    [provider]  atenolol (TENORMIN) 50 MG tablet Take 1 tablet by mouth 2 (two) times daily. 02/15/11   [provider]  beta carotene w/minerals (OCUVITE) tablet Take 1 tablet by mouth daily.    [provider]  Coenzyme Q10 (CO Q-10) 200 MG CAPS Take 1 tablet by mouth daily.    [provider]  empagliflozin (JARDIANCE) 25 MG TABS tablet TAKE 1 TABLET BY MOUTH EVERY DAY 09/26/17   [provider]  finasteride  (PROSCAR) 5 MG tablet Take 1 tablet by mouth daily. 08/09/20   [provider]  glipiZIDE (GLUCOTROL) 10 MG tablet Take 10 mg by mouth 2 (two) times daily before a meal.    [provider]  melatonin 5 MG TABS Take 5 mg by mouth at bedtime as needed.    [provider]  metFORMIN (GLUCOPHAGE) 500 MG tablet Take 1,000 mg by mouth 2 (two) times daily with a meal. Was on hold due to procedure    [provider]  Multiple Vitamins-Minerals (PRESERVISION AREDS PO) Take 1 tablet by mouth daily.    [provider]  omeprazole (PRILOSEC) 20 MG capsule Take 20 mg by mouth 2 (two) times daily before a meal.    [provider]  rosuvastatin (CRESTOR) 20 MG tablet Take 20 mg by mouth daily.    [provider]  sitaGLIPtin (JANUVIA) 100 MG tablet Take 100 mg by mouth daily.    [provider]  telmisartan (MICARDIS) 80 MG tablet Take 40 mg by mouth daily.    [provider]  traZODone (DESYREL) 50 MG tablet Take 0.5 tablets (25 mg total) by mouth at bedtime. 01/11/22   Lomax, Amy, NP      Allergies    Brilinta [ticagrelor], Dopamine, Tetanus toxoids, Warfarin sodium, and Fish oil    Review of Systems   Review of Systems  Neurological:  Positive for  weakness.  All other systems reviewed and are negative.   Physical Exam Updated Vital Signs BP 136/66 (BP Location: Left Arm)   Pulse 60   Temp 97.7 F (36.5 C)   Resp 16   SpO2 98%  Physical Exam Vitals and nursing note reviewed.  Constitutional:      General: He is not in acute distress.    Appearance: Normal appearance. He is well-developed. He is not ill-appearing, toxic-appearing or diaphoretic.  HENT:     Head: Normocephalic and atraumatic.  Eyes:     Conjunctiva/sclera: Conjunctivae normal.  Cardiovascular:     Rate and Rhythm: Normal rate and regular rhythm.     Pulses: Normal pulses.          Radial pulses are 2+ on the right side and 2+ on the left side.      Heart sounds: Normal heart sounds. No murmur heard. Pulmonary:     Effort: Pulmonary effort is normal. No respiratory distress.     Breath sounds: Normal breath sounds.  Abdominal:     Palpations: Abdomen is soft.     Tenderness: There is no abdominal tenderness.  Musculoskeletal:        General: No swelling.     Cervical back: Neck supple.     Right lower leg: No edema.     Left lower leg: No edema.  Skin:    General: Skin is warm and dry.     Capillary Refill: Capillary refill takes less than 2 seconds.  Neurological:     Mental Status: He is alert.  Psychiatric:        Mood and Affect: Mood normal.     ED Results / Procedures / Treatments   Labs (all labs ordered are listed, but only abnormal results are displayed) Labs Reviewed  BASIC METABOLIC PANEL - Abnormal; Notable for the following components:      Result Value   Glucose, Bld 260 (*)    All other components within normal limits  CBC  TROPONIN I (HIGH SENSITIVITY)  TROPONIN I (HIGH SENSITIVITY)    EKG EKG Interpretation  Date/Time:  Sunday March 21 2022 08:40:58 EDT Ventricular Rate:  64 PR Interval:  228 QRS Duration: 84 QT Interval:  402 QTC Calculation: 414 R Axis:   -5 Text Interpretation: Atrial-paced rhythm with prolonged AV conduction Nonspecific T wave abnormality Abnormal ECG Confirmed by Gloris Manchester [phone]) on 03/21/2022 3:49:29 PM  Radiology DG Chest 2 View  Result Date: 03/21/2022 CLINICAL DATA:  Atrial fibrillation.  Bradycardia this morning. EXAM: CHEST - 2 VIEW COMPARISON:  03/07/2015 FINDINGS: Dual lead pacer noted without radiographic findings of complicating feature. Dense mitral valve calcification. Coronary stent noted. Atherosclerotic calcification of the aortic arch. Heart size within normal limits. The lungs appear clear.  Mild lower thoracic spondylosis. IMPRESSION: 1. No acute findings. 2. Dual lead pacer noted without complicating feature. 3. Dense mitral valve calcification.  Electronically Signed   By: Gaylyn Rong M.D.   On: 03/21/2022 10:20    Procedures Procedures    Medications Ordered in ED Medications - No data to display  ED Course/ Medical Decision Making/ A&P Clinical Course as of 03/21/22 1757  Sun Mar 21, 2022  1713 Spoke with [facility] regarding pacemaker function, they found an episode of A tach this morning, followed by normal sinus rhythm. No pacemaker malfunction. [AS]  1742 Spoke with cardiology regarding pacemaker interrogation, they confirmed that he is okay to be discharged home and that runs of atrial  tachycardia are normal [AS]    Clinical Course User Index [AS] Shainna Faux, Edsel Petrin, PA-C                           Medical Decision Making This patient presents to the ED for concern of possible pacemaker malfunction, this involves an extensive number of treatment options, and is a complaint that carries with it a high risk of complications and morbidity.  The differential diagnosis includes pacemaker malfunction, breakthrough atrial fibrillation, ACS, PE   Co morbidities that complicate the patient evaluation  PAF on anticoagulation and paced, CAD s/p stent, DMII  My initial workup includes  Additional history obtained from: Nursing notes from this visit. EMS provides a portion of the history  I ordered, reviewed and interpreted labs which include: CBC, BMP, troponin, delta troponin. All labs within normal limits.   I ordered imaging studies including chest x-ray I independently visualized and interpreted imaging which showed no acute cardiopulmonary abnormalities I agree with the radiologist interpretation  I requested consultation with cardiology and discussed history and pertinent findings. They suggest outpatient follow up, no need for further workup.   Afebrile, hemodynamically stable.  Patient is an 81 year old male with a history of paroxysmal A-fib requiring cardiac ablation, atenolol therapy, implantable  pacemaker set at 60 beats per minutes who presented to the ED via EMS because he felt his heart rate was low.  He reported home pulse oximeter reading to be about 40 bpm this morning while drinking his coffee.  He called EMS for this, however prior to EMS arriving he states he felt normal and actually wanted to cancel EMS, however they were already there.  He was brought to the ED for further work-up.  Work-up revealed no abnormalities, specifically showed normal electrolytes and serial troponins.  Pacemaker was interrogated and showed no events.  ED EKG did show nonspecific T wave changes in lead III.  Physical exam reveals a regular rhythm at 60 bpm with no murmurs.  No adventitious breath sounds.  Pulses 2+ in all extremities.  Cap refill normal.  Patient has been asymptomatic throughout his entire stay.  Patient may have had false readings on his pulse oximeter, however I still strongly advised patient to follow-up with his cardiologist within the next 3 days.  In the absence of symptoms I believe patient is safe to go home and follow-up as an outpatient with his primary cardiologist.  Gave patient strict return precautions.  Stable at discharge.  At this time there does not appear to be any evidence of an acute emergency medical condition and the patient appears stable for discharge with appropriate outpatient follow up. Diagnosis was discussed with patient who verbalizes understanding of care plan and is agreeable to discharge. I have discussed return precautions with patient who verbalizes understanding. Patient encouraged to follow-up with their PCP within 1week. All questions answered.  Patient's case discussed with Dr. Durwin Nora who agrees with plan to discharge with follow-up.   Note: Portions of this report may have been transcribed using voice recognition software. Every effort was made to ensure accuracy; however, inadvertent computerized transcription errors may still be present.           Final Clinical Impression(s) / ED Diagnoses Final diagnoses:  None    Rx / DC Orders ED Discharge Orders     None         Mora Bellman 03/21/22 Desiree Lucy, MD  03/23/22 2105  

## 2022-03-21 NOTE — ED Triage Notes (Signed)
Pt here from home with c/o pacemaker not working correctly this morning , was in afib this am with ems but upon arrival back  in a paced rhythm with no complaints

## 2022-04-28 ENCOUNTER — Ambulatory Visit: Payer: Medicare PPO | Admitting: Family Medicine

## 2022-04-28 DIAGNOSIS — E0800 Diabetes mellitus due to underlying condition with hyperosmolarity without nonketotic hyperglycemic-hyperosmolar coma (NKHHC): Secondary | ICD-10-CM | POA: Diagnosis not present

## 2022-04-28 DIAGNOSIS — I10 Essential (primary) hypertension: Secondary | ICD-10-CM | POA: Diagnosis not present

## 2022-04-28 DIAGNOSIS — Z955 Presence of coronary angioplasty implant and graft: Secondary | ICD-10-CM | POA: Diagnosis not present

## 2022-04-28 DIAGNOSIS — I495 Sick sinus syndrome: Secondary | ICD-10-CM | POA: Diagnosis not present

## 2022-04-28 DIAGNOSIS — G4733 Obstructive sleep apnea (adult) (pediatric): Secondary | ICD-10-CM | POA: Diagnosis not present

## 2022-04-28 DIAGNOSIS — I480 Paroxysmal atrial fibrillation: Secondary | ICD-10-CM | POA: Diagnosis not present

## 2022-04-28 DIAGNOSIS — Z950 Presence of cardiac pacemaker: Secondary | ICD-10-CM | POA: Diagnosis not present

## 2022-05-10 DIAGNOSIS — H524 Presbyopia: Secondary | ICD-10-CM | POA: Diagnosis not present

## 2022-05-10 DIAGNOSIS — H353132 Nonexudative age-related macular degeneration, bilateral, intermediate dry stage: Secondary | ICD-10-CM | POA: Diagnosis not present

## 2022-05-10 DIAGNOSIS — E119 Type 2 diabetes mellitus without complications: Secondary | ICD-10-CM | POA: Diagnosis not present

## 2022-05-12 DIAGNOSIS — Z9889 Other specified postprocedural states: Secondary | ICD-10-CM | POA: Diagnosis not present

## 2022-05-12 DIAGNOSIS — I480 Paroxysmal atrial fibrillation: Secondary | ICD-10-CM | POA: Diagnosis not present

## 2022-05-12 DIAGNOSIS — I4891 Unspecified atrial fibrillation: Secondary | ICD-10-CM | POA: Diagnosis not present

## 2022-05-12 DIAGNOSIS — Z955 Presence of coronary angioplasty implant and graft: Secondary | ICD-10-CM | POA: Diagnosis not present

## 2022-05-12 DIAGNOSIS — Z79899 Other long term (current) drug therapy: Secondary | ICD-10-CM | POA: Diagnosis not present

## 2022-05-12 DIAGNOSIS — Z7901 Long term (current) use of anticoagulants: Secondary | ICD-10-CM | POA: Diagnosis not present

## 2022-05-12 DIAGNOSIS — E119 Type 2 diabetes mellitus without complications: Secondary | ICD-10-CM | POA: Diagnosis not present

## 2022-05-12 DIAGNOSIS — Z950 Presence of cardiac pacemaker: Secondary | ICD-10-CM | POA: Diagnosis not present

## 2022-05-12 DIAGNOSIS — I10 Essential (primary) hypertension: Secondary | ICD-10-CM | POA: Diagnosis not present

## 2022-05-12 DIAGNOSIS — I4892 Unspecified atrial flutter: Secondary | ICD-10-CM | POA: Diagnosis not present

## 2022-05-12 DIAGNOSIS — I2510 Atherosclerotic heart disease of native coronary artery without angina pectoris: Secondary | ICD-10-CM | POA: Diagnosis not present

## 2022-05-12 DIAGNOSIS — R002 Palpitations: Secondary | ICD-10-CM | POA: Diagnosis not present

## 2022-05-12 DIAGNOSIS — I4719 Other supraventricular tachycardia: Secondary | ICD-10-CM | POA: Diagnosis not present

## 2022-05-12 DIAGNOSIS — I495 Sick sinus syndrome: Secondary | ICD-10-CM | POA: Diagnosis not present

## 2022-05-14 DIAGNOSIS — J208 Acute bronchitis due to other specified organisms: Secondary | ICD-10-CM | POA: Diagnosis not present

## 2022-05-14 DIAGNOSIS — J069 Acute upper respiratory infection, unspecified: Secondary | ICD-10-CM | POA: Diagnosis not present

## 2022-05-26 DIAGNOSIS — I480 Paroxysmal atrial fibrillation: Secondary | ICD-10-CM | POA: Diagnosis not present

## 2022-05-26 DIAGNOSIS — Z45018 Encounter for adjustment and management of other part of cardiac pacemaker: Secondary | ICD-10-CM | POA: Diagnosis not present

## 2022-06-01 DIAGNOSIS — Z45018 Encounter for adjustment and management of other part of cardiac pacemaker: Secondary | ICD-10-CM | POA: Diagnosis not present

## 2022-06-01 DIAGNOSIS — I480 Paroxysmal atrial fibrillation: Secondary | ICD-10-CM | POA: Diagnosis not present

## 2022-06-15 DIAGNOSIS — G4733 Obstructive sleep apnea (adult) (pediatric): Secondary | ICD-10-CM | POA: Diagnosis not present

## 2022-06-15 DIAGNOSIS — I160 Hypertensive urgency: Secondary | ICD-10-CM | POA: Diagnosis not present

## 2022-06-30 DIAGNOSIS — B078 Other viral warts: Secondary | ICD-10-CM | POA: Diagnosis not present

## 2022-06-30 DIAGNOSIS — Z08 Encounter for follow-up examination after completed treatment for malignant neoplasm: Secondary | ICD-10-CM | POA: Diagnosis not present

## 2022-06-30 DIAGNOSIS — Z85828 Personal history of other malignant neoplasm of skin: Secondary | ICD-10-CM | POA: Diagnosis not present

## 2022-06-30 DIAGNOSIS — L570 Actinic keratosis: Secondary | ICD-10-CM | POA: Diagnosis not present

## 2022-06-30 DIAGNOSIS — L814 Other melanin hyperpigmentation: Secondary | ICD-10-CM | POA: Diagnosis not present

## 2022-06-30 DIAGNOSIS — D225 Melanocytic nevi of trunk: Secondary | ICD-10-CM | POA: Diagnosis not present

## 2022-06-30 DIAGNOSIS — L821 Other seborrheic keratosis: Secondary | ICD-10-CM | POA: Diagnosis not present

## 2022-07-05 ENCOUNTER — Other Ambulatory Visit: Payer: Self-pay | Admitting: *Deleted

## 2022-07-05 MED ORDER — TRAZODONE HCL 50 MG PO TABS: 25.0000 mg | ORAL_TABLET | Freq: Every day | ORAL | 0 refills | Status: DC

## 2022-07-05 NOTE — Telephone Encounter (Signed)
Last seen on 01/11/22, follow up scheduled on 07/21/22.

## 2022-07-16 DIAGNOSIS — I160 Hypertensive urgency: Secondary | ICD-10-CM | POA: Diagnosis not present

## 2022-07-16 DIAGNOSIS — G4733 Obstructive sleep apnea (adult) (pediatric): Secondary | ICD-10-CM | POA: Diagnosis not present

## 2022-07-20 NOTE — Patient Instructions (Incomplete)
Please continue using your CPAP regularly. While your insurance requires that you use CPAP at least 4 hours each night on 70% of the nights, I recommend, that you not skip any nights and use it throughout the night if you can. Getting used to CPAP and staying with the treatment long term does take time and patience and discipline. Untreated obstructive sleep apnea when it is moderate to severe can have an adverse impact on cardiovascular health and raise her risk for heart disease, arrhythmias, hypertension, congestive heart failure, stroke and diabetes. Untreated obstructive sleep apnea causes sleep disruption, nonrestorative sleep, and sleep deprivation. This can have an impact on your day to day functioning and cause daytime sleepiness and impairment of cognitive function, memory loss, mood disturbance, and problems focussing. Using CPAP regularly can improve these symptoms.  Continue trazodone   Follow up in 1 year

## 2022-07-20 NOTE — Progress Notes (Unsigned)
PATIENT: [name] DOB: 07-28-1940  REASON FOR VISIT: follow up HISTORY FROM: patient  No chief complaint on file.    HISTORY OF PRESENT ILLNESS:  07/20/22 ALL:  [name] returns for follow up for OSA on CPAP and insomnia. We started trazodone '25mg'$  [facility] at last visit 12/2021. Since,     01/11/2022 ALL: [name] is a 82 y.o. male here today for follow up for OSA on CPAP.  He was last seen by Dr Brett Fairy 04/2021 and had received replacement Phillips CPAP. He was doing well. He received a new Airsense 11 on 10/13/2021. He reports being very happy with his new machine. No difficulty with machine or supplies. He is using a traditional FFM. He does continue to have difficulty staying asleep. He usually goes to bed around 9-10 and wakes around 5. He does endorse nocturia. He is taking Advil PM or Tylenol PM as well as melatonin with some benefit.     HISTORY: (copied from Dr Dohmeier's previous note)  [name] is a 82 y.o. year old White or Caucasian male patient seen here in his RV on 04/27/2021 from  PCP at [facility]-  Dr Harlene Ramus, MD. 04-27-2021:    Mr. Rydalch, last tested on 01-19-2021 HST.  AHI 48/ h. AHI sleeping on the right side was 78/h. 31 minutes of hypoxia.    "I sleep very well with it, but the nocturia still interrupts my sleep. Related to BPH. Epworth 5-24 points, keeping active." Compliance was 100% for days and 70% for hours,  4 h 49 min. Old CPAP was set at 16 cm water pressure and he got a new one - from [facility]. FFM. Residual AHI is  0.8/h. 95% pressure is 16 cm with 2 cm EPR.      Chief concern according to patient :  having used CPAP 5 years or longer, time for re-evaluation.    I have the pleasure of seeing [name] with a known OSA sleep disorder. He was diagnosed with OSA at [facility] in [place] after he had unexplained high BP  readings about 13 years ago-  has a  has a past medical history of CAD (coronary artery disease), native  coronary artery (06/2000), Diabetes mellitus type 2, noninsulin dependent ([place]), Dyslipidemia (high LDL; low HDL), Enlarged prostate, GERD (gastroesophageal reflux disease), H/O hiatal hernia, History of bronchitis, History of hematuria, Hypertension, uncontrolled (06/2000), Migraines, OSA on CPAP, bradycardia-  PAF (paroxysmal atrial fibrillation) ([place]), Presence of bare metal stent in LAD coronary artery (06/2000), Presence of permanent cardiac pacemaker, and S/P angioplasty with stent, placement of 2 overlapping DES Xience stents, to prox. Mid Lad and PTCA of distal LAD- 12/08/11 (12/09/2011).    Sleep relevant medical history: CPAP, Nocturia 1-2 times, BPH,  Family medical /sleep history: No siblings, son has tested negative-with OSA. Social history:  Patient is working as / retired from [employer] , was a Tree surgeon for 31 years, [employer] [phone]-   He lives in a household with spouse, has adult children- daughter and son, wife is on CPAP. The patient currently is retired-  Tobacco use- none .  ETOH use rare ,  Caffeine intake in form of Coffee(  decaffeinated ) Soda( 1/ week ) Tea ( decaff) or energy drinks. Regular exercise in form of walk, yard work, farming.    Sleep habits are as follows: The patient's dinner time is between 5-7 PM. The patient goes to bed at 10 PM  and continues to sleep for 5-6 hours, wakes for 5-8 bathroom breaks.   The preferred sleep position is sideways, but often in a recliner-  , with the support of 2 pillows.  Dreams are reportedly infrequent/vivid. History of GERD-  4-5  AM is the usual rise time. The patient wakes up spontaneously. He reports not feeling refreshed or restored in AM, with symptoms such as dry mouth, morning headaches, and residual fatigue. Naps are taken frequently in a recliner , lasting from 5 to 60  minutes and are more refreshing than nocturnal sleep.    REVIEW OF SYSTEMS: Out of a complete 14 system review of symptoms, the patient  complains only of the following symptoms, insomnia and all other reviewed systems are negative.  ESS: 9/24, previously 5/24, 10/24 prior to CPAP  ALLERGIES: Allergies  Allergen Reactions   Brilinta [Ticagrelor] Anaphylaxis   Dopamine Palpitations and Other (See Comments)    "years ago; shook 15 minutes after they stopped giving it to me"   Tetanus Toxoids Swelling    "[employer] Tetanus"   Warfarin Sodium Other (See Comments)    Hematuria    Fish Oil Other (See Comments)    Hematuria     HOME MEDICATIONS: Outpatient Medications Prior to Visit  Medication Sig Dispense Refill   apixaban (ELIQUIS) 5 MG TABS tablet Take 5 mg by mouth 2 (two) times daily.     aspirin EC 81 MG tablet Take 81 mg by mouth daily.     atenolol (TENORMIN) 50 MG tablet Take 1 tablet by mouth 2 (two) times daily.     beta carotene w/minerals (OCUVITE) tablet Take 1 tablet by mouth daily.     Coenzyme Q10 (CO Q-10) 200 MG CAPS Take 1 tablet by mouth daily.     empagliflozin (JARDIANCE) 25 MG TABS tablet TAKE 1 TABLET BY MOUTH EVERY DAY     finasteride (PROSCAR) 5 MG tablet Take 1 tablet by mouth daily.     glipiZIDE (GLUCOTROL) 10 MG tablet Take 10 mg by mouth 2 (two) times daily before a meal.     melatonin 5 MG TABS Take 5 mg by mouth at bedtime as needed.     metFORMIN (GLUCOPHAGE) 500 MG tablet Take 1,000 mg by mouth 2 (two) times daily with a meal. Was on hold due to procedure     Multiple Vitamins-Minerals (PRESERVISION AREDS PO) Take 1 tablet by mouth daily.     omeprazole (PRILOSEC) 20 MG capsule Take 20 mg by mouth 2 (two) times daily before a meal.     rosuvastatin (CRESTOR) 20 MG tablet Take 20 mg by mouth daily.     sitaGLIPtin (JANUVIA) 100 MG tablet Take 100 mg by mouth daily.     telmisartan (MICARDIS) 80 MG tablet Take 40 mg by mouth daily.     traZODone (DESYREL) 50 MG tablet Take 0.5 tablets (25 mg total) by mouth at bedtime. 45 tablet 0   No facility-administered medications prior to visit.     PAST MEDICAL HISTORY: Past Medical History:  Diagnosis Date   CAD (coronary artery disease), native coronary artery 06/2000   PCI to Proximal LAD 3.0 mm x 15 mm BMS   Diabetes mellitus type 2, noninsulin dependent ([place])    complicated by CAD   Dyslipidemia (high LDL; low HDL)    Enlarged prostate    GERD (gastroesophageal reflux disease)    H/O hiatal hernia    History of bronchitis    "3 or 4 episodes over  my lifetime" (12/08/11)   History of hematuria    no longer on warfarin   Hypertension, uncontrolled 06/2000   Migraines    "used to; not anymore"   OSA on CPAP    uses cpap   PAF (paroxysmal atrial fibrillation) ([facility])    s/p RF ablation; not on warfartin   Presence of bare metal stent in LAD coronary artery 06/2000   3.0 mm x 15 mm BMS   Presence of permanent cardiac pacemaker    S/P angioplasty with stent, placement of 2 overlapping DES Xience stents, to prox. Mid Lad and PTCA of distal LAD.  12/08/11 12/09/2011    PAST SURGICAL HISTORY: Past Surgical History:  Procedure Laterality Date   APPENDECTOMY  ~ [place]  ~ 2008   CATARACT EXTRACTION W/ INTRAOCULAR LENS  IMPLANT, BILATERAL  2012   CHOLECYSTECTOMY  ~ [place] WITH STENT PLACEMENT  06/2000   "1"   CORONARY ANGIOPLASTY WITH STENT PLACEMENT  12/08/11   "2 today; total of 3 now"   CYST EXCISION Left 10/13/2020   Procedure: LEFT INDEX FINGER EXCISION  MUCOID CYST AND DEBRIDEMENT DISTAL INTERPHALNGEAL JOINT AND POSSIBLE ROTATION FLAP;  Surgeon: Leanora Cover, MD;  Location: [place];  Service: Orthopedics;  Laterality: Left;   LEFT HEART CATHETERIZATION WITH CORONARY ANGIOGRAM N/A 12/08/2011   Procedure: LEFT HEART CATHETERIZATION WITH CORONARY ANGIOGRAM;  Surgeon: Leonie Man, MD;  Location: [facility] CATH LAB;  Service: Cardiovascular;  Laterality: N/A;   [place]   "partial removal"   PERCUTANEOUS CORONARY  STENT INTERVENTION (PCI-S)  12/08/2011   Procedure: PERCUTANEOUS CORONARY STENT INTERVENTION (PCI-S);  Surgeon: Leonie Man, MD;  Location: [facility] CATH LAB;  Service: Cardiovascular;;   PROSTATE SURGERY  2005-2008   "several exploratory OR's to determine why I was bleeding; 3 or 4"   RENAL ANGIOGRAM N/A 12/08/2011   Procedure: RENAL ANGIOGRAM;  Surgeon: Leonie Man, MD;  [facility];  Service: Cardiovascular;  Laterality: N/A;   TONSILLECTOMY AND ADENOIDECTOMY  1952    FAMILY HISTORY: Family History  Problem Relation Age of Onset   Alzheimer's disease Mother    Cancer Mother    Heart attack Father     SOCIAL HISTORY: Social History   Socioeconomic History   Marital status: Married    Spouse name: miina   Number of children: 3   Years of education: Not on file   Highest education level: Master's degree (e.g., MA, MS, MEng, MEd, MSW, MBA)  Occupational History   Not on file  Tobacco Use   Smoking status: Never   Smokeless tobacco: Never  Substance and Sexual Activity   Alcohol use: No   Drug use: No   Sexual activity: Yes  Other Topics Concern   Not on file  Social History Narrative   Live with wife   Right handed   Caffeine: no caffeine    Social Determinants of Health   Financial Resource Strain: Not on file  Food Insecurity: Not on file  Transportation Needs: Not on file  Physical Activity: Not on file  Stress: Not on file  Social Connections: Not on file  Intimate Partner Violence: Not on file     PHYSICAL EXAM  There were no vitals filed for this visit.  There is no height or weight on file to calculate BMI.  Generalized: Well developed, in no acute  distress  Cardiology: normal rate and rhythm, no murmur noted Respiratory: clear to auscultation bilaterally  Neurological examination  Mentation: Alert oriented to time, place, history taking. Follows all commands speech and language fluent Cranial nerve II-XII: Pupils were equal round  reactive to light. Extraocular movements were full, visual field were full  Motor: The motor testing reveals 5 over 5 strength of all 4 extremities. Good symmetric motor tone is noted throughout.  Gait and station: Gait is normal.    DIAGNOSTIC DATA (LABS, IMAGING, TESTING) - I reviewed patient records, labs, notes, testing and imaging myself where available.      No data to display           Lab Results  Component Value Date   WBC 6.7 03/21/2022   HGB 15.6 03/21/2022   HCT 47.9 03/21/2022   MCV 84.3 03/21/2022   PLT 218 03/21/2022      Component Value Date/Time   NA 138 03/21/2022 1223   K 4.1 03/21/2022 1223   CL 104 03/21/2022 1223   CO2 23 03/21/2022 1223   GLUCOSE 260 (H) 03/21/2022 1223   BUN 14 03/21/2022 1223   CREATININE 0.94 03/21/2022 1223   CALCIUM 9.4 03/21/2022 1223   PROT 7.5 01/18/2022 1455   ALBUMIN 4.0 01/18/2022 1455   AST 18 01/18/2022 1455   ALT 19 01/18/2022 1455   ALKPHOS 83 01/18/2022 1455   BILITOT 0.7 01/18/2022 1455   GFRNONAA >60 03/21/2022 1223   GFRAA >90 12/09/2011 0532   No results found for: "CHOL", "HDL", "LDLCALC", "LDLDIRECT", "TRIG", "CHOLHDL" No results found for: "HGBA1C" No results found for: "VITAMINB12"    ASSESSMENT AND PLAN 82 y.o. year old male  has a past medical history of CAD (coronary artery disease), native coronary artery (06/2000), Diabetes mellitus type 2, noninsulin dependent ([place]), Dyslipidemia (high LDL; low HDL), Enlarged prostate, GERD (gastroesophageal reflux disease), H/O hiatal hernia, History of bronchitis, History of hematuria, Hypertension, uncontrolled (06/2000), Migraines, OSA on CPAP, PAF (paroxysmal atrial fibrillation) ([place]), Presence of bare metal stent in LAD coronary artery (06/2000), Presence of permanent cardiac pacemaker, and S/P angioplasty with stent, placement of 2 overlapping DES Xience stents, to prox. Mid Lad and PTCA of distal LAD.  12/08/11 (12/09/2011). here with     ICD-10-CM   1. OSA  on CPAP  G47.33     2. Psychophysiological insomnia  F51.04         DAMAR CHARVET is doing well on CPAP therapy. Compliance report reveals excellent compliance. AHI is 6/hr. We will monitor this closely. Previously 0.8/hr on [employer]. Pressure settings look good, no significant air leak. We will try low dose trazodone '25mg'$  QHS. May increase to '50mg'$  QHS if well tolerated. He was encouraged to continue using CPAP nightly and for greater than 4 hours each night. We will update supply orders as indicated. Risks of untreated sleep apnea review and education materials provided. Healthy lifestyle habits encouraged. He will follow up in 6 months, sooner if needed. He verbalizes understanding and agreement with this plan.    No orders of the defined types were placed in this encounter.    No orders of the defined types were placed in this encounter.     Debbora Presto, FNP-C 07/20/2022, 4:32 PM [facility] [address] [place], [place] [address] [phone]

## 2022-07-21 ENCOUNTER — Encounter: Payer: Self-pay | Admitting: Family Medicine

## 2022-07-21 ENCOUNTER — Ambulatory Visit: Payer: Medicare PPO | Admitting: Family Medicine

## 2022-07-21 VITALS — BP 123/69 | HR 65 | Ht 71.0 in | Wt 203.0 lb

## 2022-07-21 DIAGNOSIS — G4733 Obstructive sleep apnea (adult) (pediatric): Secondary | ICD-10-CM | POA: Diagnosis not present

## 2022-07-21 DIAGNOSIS — F5104 Psychophysiologic insomnia: Secondary | ICD-10-CM | POA: Diagnosis not present

## 2022-07-22 DIAGNOSIS — H353132 Nonexudative age-related macular degeneration, bilateral, intermediate dry stage: Secondary | ICD-10-CM | POA: Diagnosis not present

## 2022-07-27 DIAGNOSIS — Z6828 Body mass index (BMI) 28.0-28.9, adult: Secondary | ICD-10-CM | POA: Diagnosis not present

## 2022-07-27 DIAGNOSIS — Z658 Other specified problems related to psychosocial circumstances: Secondary | ICD-10-CM | POA: Diagnosis not present

## 2022-07-27 DIAGNOSIS — Z09 Encounter for follow-up examination after completed treatment for conditions other than malignant neoplasm: Secondary | ICD-10-CM | POA: Diagnosis not present

## 2022-07-27 DIAGNOSIS — I10 Essential (primary) hypertension: Secondary | ICD-10-CM | POA: Diagnosis not present

## 2022-07-27 DIAGNOSIS — D6869 Other thrombophilia: Secondary | ICD-10-CM | POA: Diagnosis not present

## 2022-07-27 DIAGNOSIS — E1169 Type 2 diabetes mellitus with other specified complication: Secondary | ICD-10-CM | POA: Diagnosis not present

## 2022-07-27 DIAGNOSIS — I480 Paroxysmal atrial fibrillation: Secondary | ICD-10-CM | POA: Diagnosis not present

## 2022-07-27 DIAGNOSIS — E1165 Type 2 diabetes mellitus with hyperglycemia: Secondary | ICD-10-CM | POA: Diagnosis not present

## 2022-08-05 DIAGNOSIS — E782 Mixed hyperlipidemia: Secondary | ICD-10-CM | POA: Diagnosis not present

## 2022-08-05 DIAGNOSIS — I4891 Unspecified atrial fibrillation: Secondary | ICD-10-CM | POA: Diagnosis not present

## 2022-08-05 DIAGNOSIS — Z7984 Long term (current) use of oral hypoglycemic drugs: Secondary | ICD-10-CM | POA: Diagnosis not present

## 2022-08-05 DIAGNOSIS — I2510 Atherosclerotic heart disease of native coronary artery without angina pectoris: Secondary | ICD-10-CM | POA: Diagnosis not present

## 2022-08-05 DIAGNOSIS — I209 Angina pectoris, unspecified: Secondary | ICD-10-CM | POA: Diagnosis not present

## 2022-08-05 DIAGNOSIS — E119 Type 2 diabetes mellitus without complications: Secondary | ICD-10-CM | POA: Diagnosis not present

## 2022-08-05 DIAGNOSIS — I7090 Unspecified atherosclerosis: Secondary | ICD-10-CM | POA: Diagnosis not present

## 2022-08-05 DIAGNOSIS — I2089 Other forms of angina pectoris: Secondary | ICD-10-CM | POA: Diagnosis not present

## 2022-08-05 DIAGNOSIS — Z950 Presence of cardiac pacemaker: Secondary | ICD-10-CM | POA: Diagnosis not present

## 2022-08-05 DIAGNOSIS — Z7901 Long term (current) use of anticoagulants: Secondary | ICD-10-CM | POA: Diagnosis not present

## 2022-08-05 DIAGNOSIS — I495 Sick sinus syndrome: Secondary | ICD-10-CM | POA: Diagnosis not present

## 2022-08-05 DIAGNOSIS — I10 Essential (primary) hypertension: Secondary | ICD-10-CM | POA: Diagnosis not present

## 2022-08-14 DIAGNOSIS — I160 Hypertensive urgency: Secondary | ICD-10-CM | POA: Diagnosis not present

## 2022-08-14 DIAGNOSIS — G4733 Obstructive sleep apnea (adult) (pediatric): Secondary | ICD-10-CM | POA: Diagnosis not present

## 2022-08-18 DIAGNOSIS — I2089 Other forms of angina pectoris: Secondary | ICD-10-CM | POA: Diagnosis not present

## 2022-08-18 DIAGNOSIS — I10 Essential (primary) hypertension: Secondary | ICD-10-CM | POA: Diagnosis not present

## 2022-08-18 DIAGNOSIS — M79602 Pain in left arm: Secondary | ICD-10-CM | POA: Diagnosis not present

## 2022-08-18 DIAGNOSIS — I2510 Atherosclerotic heart disease of native coronary artery without angina pectoris: Secondary | ICD-10-CM | POA: Diagnosis not present

## 2022-08-18 DIAGNOSIS — E782 Mixed hyperlipidemia: Secondary | ICD-10-CM | POA: Diagnosis not present

## 2022-08-18 DIAGNOSIS — I25118 Atherosclerotic heart disease of native coronary artery with other forms of angina pectoris: Secondary | ICD-10-CM | POA: Diagnosis not present

## 2022-08-18 DIAGNOSIS — Z951 Presence of aortocoronary bypass graft: Secondary | ICD-10-CM | POA: Diagnosis not present

## 2022-08-18 DIAGNOSIS — I4510 Unspecified right bundle-branch block: Secondary | ICD-10-CM | POA: Diagnosis not present

## 2022-08-25 DIAGNOSIS — I480 Paroxysmal atrial fibrillation: Secondary | ICD-10-CM | POA: Diagnosis not present

## 2022-08-25 DIAGNOSIS — Z45018 Encounter for adjustment and management of other part of cardiac pacemaker: Secondary | ICD-10-CM | POA: Diagnosis not present

## 2022-08-25 DIAGNOSIS — I495 Sick sinus syndrome: Secondary | ICD-10-CM | POA: Diagnosis not present

## 2022-09-14 DIAGNOSIS — G4733 Obstructive sleep apnea (adult) (pediatric): Secondary | ICD-10-CM | POA: Diagnosis not present

## 2022-09-14 DIAGNOSIS — I160 Hypertensive urgency: Secondary | ICD-10-CM | POA: Diagnosis not present

## 2022-09-22 DIAGNOSIS — I495 Sick sinus syndrome: Secondary | ICD-10-CM | POA: Diagnosis not present

## 2022-09-22 DIAGNOSIS — Z45018 Encounter for adjustment and management of other part of cardiac pacemaker: Secondary | ICD-10-CM | POA: Diagnosis not present

## 2022-09-22 DIAGNOSIS — I480 Paroxysmal atrial fibrillation: Secondary | ICD-10-CM | POA: Diagnosis not present

## 2022-10-14 DIAGNOSIS — I160 Hypertensive urgency: Secondary | ICD-10-CM | POA: Diagnosis not present

## 2022-10-14 DIAGNOSIS — G4733 Obstructive sleep apnea (adult) (pediatric): Secondary | ICD-10-CM | POA: Diagnosis not present

## 2022-12-06 DIAGNOSIS — I480 Paroxysmal atrial fibrillation: Secondary | ICD-10-CM | POA: Diagnosis not present

## 2022-12-15 DIAGNOSIS — G4733 Obstructive sleep apnea (adult) (pediatric): Secondary | ICD-10-CM | POA: Diagnosis not present

## 2022-12-21 ENCOUNTER — Other Ambulatory Visit: Payer: Self-pay | Admitting: Gastroenterology

## 2022-12-21 DIAGNOSIS — R1312 Dysphagia, oropharyngeal phase: Secondary | ICD-10-CM

## 2022-12-21 DIAGNOSIS — K225 Diverticulum of esophagus, acquired: Secondary | ICD-10-CM | POA: Diagnosis not present

## 2022-12-27 ENCOUNTER — Other Ambulatory Visit: Payer: Medicare PPO

## 2022-12-28 ENCOUNTER — Other Ambulatory Visit: Payer: Medicare PPO

## 2022-12-29 DIAGNOSIS — E119 Type 2 diabetes mellitus without complications: Secondary | ICD-10-CM | POA: Diagnosis not present

## 2022-12-29 DIAGNOSIS — I10 Essential (primary) hypertension: Secondary | ICD-10-CM | POA: Diagnosis not present

## 2022-12-29 DIAGNOSIS — Z950 Presence of cardiac pacemaker: Secondary | ICD-10-CM | POA: Diagnosis not present

## 2022-12-29 DIAGNOSIS — I2510 Atherosclerotic heart disease of native coronary artery without angina pectoris: Secondary | ICD-10-CM | POA: Diagnosis not present

## 2022-12-29 DIAGNOSIS — I480 Paroxysmal atrial fibrillation: Secondary | ICD-10-CM | POA: Diagnosis not present

## 2022-12-29 DIAGNOSIS — Z955 Presence of coronary angioplasty implant and graft: Secondary | ICD-10-CM | POA: Diagnosis not present

## 2022-12-30 ENCOUNTER — Ambulatory Visit
Admission: RE | Admit: 2022-12-30 | Discharge: 2022-12-30 | Disposition: A | Discharge status: Home / Self Care | Payer: Medicare PPO | Source: Ambulatory Visit | Attending: Gastroenterology | Admitting: Gastroenterology

## 2022-12-30 DIAGNOSIS — K225 Diverticulum of esophagus, acquired: Secondary | ICD-10-CM | POA: Diagnosis not present

## 2022-12-30 DIAGNOSIS — R1310 Dysphagia, unspecified: Secondary | ICD-10-CM | POA: Diagnosis not present

## 2022-12-30 DIAGNOSIS — R1312 Dysphagia, oropharyngeal phase: Secondary | ICD-10-CM

## 2023-01-11 DIAGNOSIS — M5416 Radiculopathy, lumbar region: Secondary | ICD-10-CM | POA: Diagnosis not present

## 2023-01-20 DIAGNOSIS — E782 Mixed hyperlipidemia: Secondary | ICD-10-CM | POA: Diagnosis not present

## 2023-01-20 DIAGNOSIS — Z0000 Encounter for general adult medical examination without abnormal findings: Secondary | ICD-10-CM | POA: Diagnosis not present

## 2023-01-20 DIAGNOSIS — H6123 Impacted cerumen, bilateral: Secondary | ICD-10-CM | POA: Diagnosis not present

## 2023-01-20 DIAGNOSIS — Z6828 Body mass index (BMI) 28.0-28.9, adult: Secondary | ICD-10-CM | POA: Diagnosis not present

## 2023-01-20 DIAGNOSIS — E1169 Type 2 diabetes mellitus with other specified complication: Secondary | ICD-10-CM | POA: Diagnosis not present

## 2023-01-20 DIAGNOSIS — I10 Essential (primary) hypertension: Secondary | ICD-10-CM | POA: Diagnosis not present

## 2023-01-20 DIAGNOSIS — E119 Type 2 diabetes mellitus without complications: Secondary | ICD-10-CM | POA: Diagnosis not present

## 2023-01-20 DIAGNOSIS — Z7901 Long term (current) use of anticoagulants: Secondary | ICD-10-CM | POA: Diagnosis not present

## 2023-01-20 DIAGNOSIS — I480 Paroxysmal atrial fibrillation: Secondary | ICD-10-CM | POA: Diagnosis not present

## 2023-01-20 DIAGNOSIS — D6869 Other thrombophilia: Secondary | ICD-10-CM | POA: Diagnosis not present

## 2023-01-31 DIAGNOSIS — M5416 Radiculopathy, lumbar region: Secondary | ICD-10-CM | POA: Diagnosis not present

## 2023-02-15 DIAGNOSIS — M5416 Radiculopathy, lumbar region: Secondary | ICD-10-CM | POA: Diagnosis not present

## 2023-03-07 DIAGNOSIS — I480 Paroxysmal atrial fibrillation: Secondary | ICD-10-CM | POA: Diagnosis not present

## 2023-03-28 DIAGNOSIS — Z45018 Encounter for adjustment and management of other part of cardiac pacemaker: Secondary | ICD-10-CM | POA: Diagnosis not present

## 2023-04-26 DIAGNOSIS — H353132 Nonexudative age-related macular degeneration, bilateral, intermediate dry stage: Secondary | ICD-10-CM | POA: Diagnosis not present

## 2023-05-03 DIAGNOSIS — R1319 Other dysphagia: Secondary | ICD-10-CM | POA: Diagnosis not present

## 2023-05-03 DIAGNOSIS — R1314 Dysphagia, pharyngoesophageal phase: Secondary | ICD-10-CM | POA: Diagnosis not present

## 2023-05-03 DIAGNOSIS — R490 Dysphonia: Secondary | ICD-10-CM | POA: Diagnosis not present

## 2023-05-03 DIAGNOSIS — J383 Other diseases of vocal cords: Secondary | ICD-10-CM | POA: Diagnosis not present

## 2023-05-03 DIAGNOSIS — R1310 Dysphagia, unspecified: Secondary | ICD-10-CM | POA: Diagnosis not present

## 2023-05-03 DIAGNOSIS — K225 Diverticulum of esophagus, acquired: Secondary | ICD-10-CM | POA: Diagnosis not present

## 2023-05-03 DIAGNOSIS — K449 Diaphragmatic hernia without obstruction or gangrene: Secondary | ICD-10-CM | POA: Diagnosis not present

## 2023-05-03 DIAGNOSIS — Q396 Congenital diverticulum of esophagus: Secondary | ICD-10-CM | POA: Diagnosis not present

## 2023-05-13 DIAGNOSIS — R1310 Dysphagia, unspecified: Secondary | ICD-10-CM | POA: Diagnosis not present

## 2023-05-13 DIAGNOSIS — K449 Diaphragmatic hernia without obstruction or gangrene: Secondary | ICD-10-CM | POA: Diagnosis not present

## 2023-05-13 DIAGNOSIS — Q396 Congenital diverticulum of esophagus: Secondary | ICD-10-CM | POA: Diagnosis not present

## 2023-05-13 DIAGNOSIS — R1319 Other dysphagia: Secondary | ICD-10-CM | POA: Diagnosis not present

## 2023-05-26 DIAGNOSIS — I152 Hypertension secondary to endocrine disorders: Secondary | ICD-10-CM | POA: Diagnosis not present

## 2023-05-26 DIAGNOSIS — Q396 Congenital diverticulum of esophagus: Secondary | ICD-10-CM | POA: Diagnosis not present

## 2023-05-26 DIAGNOSIS — Z7901 Long term (current) use of anticoagulants: Secondary | ICD-10-CM | POA: Diagnosis not present

## 2023-05-26 DIAGNOSIS — E119 Type 2 diabetes mellitus without complications: Secondary | ICD-10-CM | POA: Diagnosis not present

## 2023-05-26 DIAGNOSIS — G4733 Obstructive sleep apnea (adult) (pediatric): Secondary | ICD-10-CM | POA: Diagnosis not present

## 2023-05-26 DIAGNOSIS — I4891 Unspecified atrial fibrillation: Secondary | ICD-10-CM | POA: Diagnosis not present

## 2023-05-26 DIAGNOSIS — K225 Diverticulum of esophagus, acquired: Secondary | ICD-10-CM | POA: Diagnosis not present

## 2023-05-30 DIAGNOSIS — K225 Diverticulum of esophagus, acquired: Secondary | ICD-10-CM | POA: Diagnosis not present

## 2023-05-30 DIAGNOSIS — Z4659 Encounter for fitting and adjustment of other gastrointestinal appliance and device: Secondary | ICD-10-CM | POA: Diagnosis not present

## 2023-05-31 DIAGNOSIS — K225 Diverticulum of esophagus, acquired: Secondary | ICD-10-CM | POA: Diagnosis not present

## 2023-06-06 DIAGNOSIS — I480 Paroxysmal atrial fibrillation: Secondary | ICD-10-CM | POA: Diagnosis not present

## 2023-07-06 DIAGNOSIS — L821 Other seborrheic keratosis: Secondary | ICD-10-CM | POA: Diagnosis not present

## 2023-07-06 DIAGNOSIS — B078 Other viral warts: Secondary | ICD-10-CM | POA: Diagnosis not present

## 2023-07-06 DIAGNOSIS — L814 Other melanin hyperpigmentation: Secondary | ICD-10-CM | POA: Diagnosis not present

## 2023-07-06 DIAGNOSIS — L570 Actinic keratosis: Secondary | ICD-10-CM | POA: Diagnosis not present

## 2023-07-06 DIAGNOSIS — Z85828 Personal history of other malignant neoplasm of skin: Secondary | ICD-10-CM | POA: Diagnosis not present

## 2023-07-06 DIAGNOSIS — Z08 Encounter for follow-up examination after completed treatment for malignant neoplasm: Secondary | ICD-10-CM | POA: Diagnosis not present

## 2023-07-06 DIAGNOSIS — D225 Melanocytic nevi of trunk: Secondary | ICD-10-CM | POA: Diagnosis not present

## 2023-07-07 DIAGNOSIS — R1314 Dysphagia, pharyngoesophageal phase: Secondary | ICD-10-CM | POA: Diagnosis not present

## 2023-07-07 DIAGNOSIS — R1319 Other dysphagia: Secondary | ICD-10-CM | POA: Diagnosis not present

## 2023-07-07 DIAGNOSIS — K449 Diaphragmatic hernia without obstruction or gangrene: Secondary | ICD-10-CM | POA: Diagnosis not present

## 2023-07-07 DIAGNOSIS — J383 Other diseases of vocal cords: Secondary | ICD-10-CM | POA: Diagnosis not present

## 2023-07-07 DIAGNOSIS — K225 Diverticulum of esophagus, acquired: Secondary | ICD-10-CM | POA: Diagnosis not present

## 2023-07-07 DIAGNOSIS — R1310 Dysphagia, unspecified: Secondary | ICD-10-CM | POA: Diagnosis not present

## 2023-07-07 DIAGNOSIS — Q396 Congenital diverticulum of esophagus: Secondary | ICD-10-CM | POA: Diagnosis not present

## 2023-07-07 DIAGNOSIS — R1312 Dysphagia, oropharyngeal phase: Secondary | ICD-10-CM | POA: Diagnosis not present

## 2023-07-11 DIAGNOSIS — M5416 Radiculopathy, lumbar region: Secondary | ICD-10-CM | POA: Diagnosis not present

## 2023-07-19 NOTE — Patient Instructions (Signed)

## 2023-07-19 NOTE — Progress Notes (Unsigned)
 PATIENT: [name] DOB: 02-Sep-1940  REASON FOR VISIT: follow up HISTORY FROM: patient  No chief complaint on file.    HISTORY OF PRESENT ILLNESS:  07/19/23 ALL:  [name] returns for follow up for OSA on CPAP and insomnia.   Melatonin?   07/21/2022 ALL:  [name] returns for follow up for OSA on CPAP and insomnia. We started trazodone 25mg  QHS at last visit 12/2021. Since, he reports doing well on CPAP therapy. He is using CPAP nightly for about 7 hours. He does have water in tube but feels it is due to keeping humidity levels high. He uses humidifier in his room.  He did not feel trazodone was effective. He is taking melatonin 3mg  daily that has helped. He does continue to have some insomnia but contributes this to stressors with [place]CD[employer] and his property. He is not overly bothered by this. 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 He has lost about 40-50 pounds on Jardiance. Recently started back on sertraline by PCP.     01/11/2022 ALL: [name] is a 83 y.o. male here today for follow up for OSA on CPAP.  He was last seen by Dr Vickey Huger 04/2021 and had received replacement [employer] CPAP. He was doing well. He received a new Airsense 11 on 10/13/2021. He reports being very happy with his new machine. No difficulty with machine or supplies. He is using a traditional FFM. He does continue to have difficulty staying asleep. He usually goes to bed around 9-10 and wakes around 5. He does endorse nocturia. He is taking Advil PM or Tylenol PM as well as melatonin with some benefit.     HISTORY: (copied from Dr Dohmeier's previous note)  [name] is a 83 y.o. year old White or Caucasian male patient seen here in his RV on 04/27/2021 from  PCP at [facility]-  Dr Barton Fanny, MD. 04-27-2021:    James. Goswami, last tested on 01-19-2021 HST.  AHI 48/ h. AHI sleeping on the right side was 78/h. 31 minutes of hypoxia.    "I sleep very well with it, but the nocturia still interrupts my sleep.  Related to BPH. Epworth 5-24 points, keeping active." Compliance was 100% for days and 70% for hours,  4 h 49 min. Old CPAP was set at 16 cm water pressure and he got a new one - from [facility]. FFM. Residual AHI is  0.8/h. 95% pressure is 16 cm with 2 cm EPR.      Chief concern according to patient :  having used CPAP 5 years or longer, time for re-evaluation.    I have the pleasure of seeing [name] with a known OSA sleep disorder. He was diagnosed with OSA at [facility] in [place] after he had unexplained high BP  readings about 13 years ago-  has a  has a past medical history of CAD (coronary artery disease), native coronary artery (06/2000), Diabetes mellitus type 2, noninsulin dependent (HCC), Dyslipidemia (high LDL; low HDL), Enlarged prostate, GERD (gastroesophageal reflux disease), H/O hiatal hernia, History of bronchitis, History of hematuria, Hypertension, uncontrolled (06/2000), Migraines, OSA on CPAP, bradycardia-  PAF (paroxysmal atrial fibrillation) (HCC), Presence of bare metal stent in LAD coronary artery (06/2000), Presence of permanent cardiac pacemaker, and S/P angioplasty with stent, placement of 2 overlapping DES Xience stents, to prox. Mid Lad and PTCA of distal LAD- 12/08/11 (12/09/2011).    Sleep relevant medical history: CPAP, Nocturia 1-2 times,  BPH,  Family medical /sleep history: No siblings, son has tested negative-with OSA. Social history:  Patient is working as / retired from Agricultural consultant , was a Animal nutritionist for 31 years, [employer] [phone]-   He lives in a household with spouse, has adult children- daughter and son, wife is on CPAP. The patient currently is retired-  Tobacco use- none .  ETOH use rare ,  Caffeine intake in form of Coffee(  decaffeinated ) Soda( 1/ week ) Tea ( decaff) or energy drinks. Regular exercise in form of walk, yard work, farming.    Sleep habits are as follows: The patient's dinner time is between 5-7 PM. The patient goes to bed  at 10 PM and continues to sleep for 5-6 hours, wakes for 5-8 bathroom breaks.   The preferred sleep position is sideways, but often in a recliner-  , with the support of 2 pillows.  Dreams are reportedly infrequent/vivid. History of GERD-  4-5  AM is the usual rise time. The patient wakes up spontaneously. He reports not feeling refreshed or restored in AM, with symptoms such as dry mouth, morning headaches, and residual fatigue. Naps are taken frequently in a recliner , lasting from 5 to 60  minutes and are more refreshing than nocturnal sleep.    REVIEW OF SYSTEMS: Out of a complete 14 system review of symptoms, the patient complains only of the following symptoms, insomnia and all other reviewed systems are negative.  ESS: 16/24, previously 9/24  ALLERGIES: Allergies  Allergen Reactions   Brilinta [Ticagrelor] Anaphylaxis   Dopamine Palpitations and Other (See Comments)    "years ago; shook 15 minutes after they stopped giving it to me"   Tetanus Toxoids Swelling    "Oraserum Tetanus"   Warfarin Sodium Other (See Comments)    Hematuria    Fish Oil Other (See Comments)    Hematuria     HOME MEDICATIONS: Outpatient Medications Prior to Visit  Medication Sig Dispense Refill   apixaban (ELIQUIS) 5 MG TABS tablet Take 5 mg by mouth 2 (two) times daily.     aspirin EC 81 MG tablet Take 81 mg by mouth daily.     atenolol (TENORMIN) 50 MG tablet Take 1 tablet by mouth 2 (two) times daily.     Coenzyme Q10 (CO Q-10) 200 MG CAPS Take 1 tablet by mouth daily.     empagliflozin (JARDIANCE) 25 MG TABS tablet TAKE 1 TABLET BY MOUTH EVERY DAY     finasteride (PROSCAR) 5 MG tablet Take 1 tablet by mouth daily.     glipiZIDE (GLUCOTROL) 10 MG tablet Take 10 mg by mouth 2 (two) times daily before a meal.     melatonin 5 MG TABS Take 5 mg by mouth at bedtime as needed.     metFORMIN (GLUCOPHAGE) 500 MG tablet Take 1,000 mg by mouth 2 (two) times daily with a meal. Was on hold due to procedure      Multiple Vitamins-Minerals (PRESERVISION AREDS PO) Take 1 tablet by mouth daily.     nystatin ointment (MYCOSTATIN) Apply 1 Application topically 2 (two) times daily.     rosuvastatin (CRESTOR) 20 MG tablet Take 20 mg by mouth daily.     sitaGLIPtin (JANUVIA) 100 MG tablet Take 100 mg by mouth daily.     telmisartan (MICARDIS) 80 MG tablet Take 40 mg by mouth daily.     triamcinolone (KENALOG) 0.025 % ointment Apply 1 Application topically 2 (two) times daily.  No facility-administered medications prior to visit.    PAST MEDICAL HISTORY: Past Medical History:  Diagnosis Date   CAD (coronary artery disease), native coronary artery 06/2000   PCI to Proximal LAD 3.0 mm x 15 mm BMS   Diabetes mellitus type 2, noninsulin dependent (HCC)    complicated by CAD   Dyslipidemia (high LDL; low HDL)    Enlarged prostate    GERD (gastroesophageal reflux disease)    H/O hiatal hernia    History of bronchitis    "3 or 4 episodes over my lifetime" (12/08/11)   History of hematuria    no longer on warfarin   Hypertension, uncontrolled 06/2000   Migraines    "used to; not anymore"   OSA on CPAP    uses cpap   PAF (paroxysmal atrial fibrillation) (HCC)    s/p RF ablation; not on warfartin   Presence of bare metal stent in LAD coronary artery 06/2000   3.0 mm x 15 mm BMS   Presence of permanent cardiac pacemaker    S/P angioplasty with stent, placement of 2 overlapping DES Xience stents, to prox. Mid Lad and PTCA of distal LAD.  12/08/11 12/09/2011    PAST SURGICAL HISTORY: Past Surgical History:  Procedure Laterality Date   APPENDECTOMY  ~ 1976   BACK SURGERY     CARDIAC ELECTROPHYSIOLOGY STUDY AND ABLATION  ~ 2008   CATARACT EXTRACTION W/ INTRAOCULAR LENS  IMPLANT, BILATERAL  2012   CHOLECYSTECTOMY  ~ 1976   CORONARY ANGIOPLASTY WITH STENT PLACEMENT  06/2000   "1"   CORONARY ANGIOPLASTY WITH STENT PLACEMENT  12/08/11   "2 today; total of 3 now"   CYST EXCISION Left 10/13/2020    Procedure: LEFT INDEX FINGER EXCISION  MUCOID CYST AND DEBRIDEMENT DISTAL INTERPHALNGEAL JOINT AND POSSIBLE ROTATION FLAP;  Surgeon: Betha Loa, MD;  Location: [facility];  Service: Orthopedics;  Laterality: Left;   LEFT HEART CATHETERIZATION WITH CORONARY ANGIOGRAM N/A 12/08/2011   Procedure: LEFT HEART CATHETERIZATION WITH CORONARY ANGIOGRAM;  Surgeon: Marykay Lex, MD;  Location: [facility] CATH LAB;  Service: Cardiovascular;  Laterality: N/A;   LUMBAR DISC SURGERY  1996   "partial removal"   PERCUTANEOUS CORONARY STENT INTERVENTION (PCI-S)  12/08/2011   Procedure: PERCUTANEOUS CORONARY STENT INTERVENTION (PCI-S);  Surgeon: Marykay Lex, MD;  Location: [facility] CATH LAB;  Service: Cardiovascular;;   PROSTATE SURGERY  2005-2008   "several exploratory OR's to determine why I was bleeding; 3 or 4"   RENAL ANGIOGRAM N/A 12/08/2011   Procedure: RENAL ANGIOGRAM;  Surgeon: Marykay Lex, MD;  Location: [facility] CATH LAB;  Service: Cardiovascular;  Laterality: N/A;   TONSILLECTOMY AND ADENOIDECTOMY  1952    FAMILY HISTORY: Family History  Problem Relation Age of Onset   Alzheimer's disease Mother    Cancer Mother    Heart attack Father     SOCIAL HISTORY: Social History   Socioeconomic History   Marital status: Married    Spouse name: miina   Number of children: 3   Years of education: Not on file   Highest education level: Master's degree (e.g., MA, MS, MEng, MEd, MSW, MBA)  Occupational History   Not on file  Tobacco Use   Smoking status: Never   Smokeless tobacco: Never  Substance and Sexual Activity   Alcohol use: No   Drug use: No   Sexual activity: Yes  Other Topics Concern   Not on file  Social History Narrative   Live with wife  Right handed   Caffeine: no caffeine    Social Drivers of Corporate investment banker Strain: Not on file  Food Insecurity: Low Risk  (05/30/2023)   Received from [facility]   Hunger Vital Sign    Worried About Running Out of Food  in the Last Year: Never true    Ran Out of Food in the Last Year: Never true  Transportation Needs: No Transportation Needs (05/30/2023)   Received from [facility]    In the past 12 months, has lack of reliable transportation kept you from medical appointments, meetings, work or from getting things needed for daily living? : No  Physical Activity: Not on file  Stress: Not on file  Social Connections: Unknown (01/20/2022)   Received from [facility], [facility]   Social Network    Social Network: Not on file  Intimate Partner Violence: Unknown (01/20/2022)   Received from [facility], [facility]   HITS    Physically Hurt: Not on file    Insult or Talk Down To: Not on file    Threaten Physical Harm: Not on file    Scream or Curse: Not on file     PHYSICAL EXAM  There were no vitals filed for this visit.   There is no height or weight on file to calculate BMI.  Generalized: Well developed, in no acute distress  Cardiology: normal rate and rhythm, no murmur noted Respiratory: clear to auscultation bilaterally  Neurological examination  Mentation: Alert oriented to time, place, history taking. Follows all commands speech and language fluent Cranial nerve II-XII: Pupils were equal round reactive to light. Extraocular movements were full, visual field were full  Motor: The motor testing reveals 5 over 5 strength of all 4 extremities. Good symmetric motor tone is noted throughout.  Gait and station: Gait is normal.    DIAGNOSTIC DATA (LABS, IMAGING, TESTING) - I reviewed patient records, labs, notes, testing and imaging myself where available.      No data to display           Lab Results  Component Value Date   WBC 6.7 03/21/2022   HGB 15.6 03/21/2022   HCT 47.9 03/21/2022   MCV 84.3 03/21/2022   PLT 218 03/21/2022      Component Value Date/Time   NA 138 03/21/2022 1223   K 4.1 03/21/2022 1223   CL 104 03/21/2022 1223   CO2 23  03/21/2022 1223   GLUCOSE 260 (H) 03/21/2022 1223   BUN 14 03/21/2022 1223   CREATININE 0.94 03/21/2022 1223   CALCIUM 9.4 03/21/2022 1223   PROT 7.5 01/18/2022 1455   ALBUMIN 4.0 01/18/2022 1455   AST 18 01/18/2022 1455   ALT 19 01/18/2022 1455   ALKPHOS 83 01/18/2022 1455   BILITOT 0.7 01/18/2022 1455   GFRNONAA >60 03/21/2022 1223   GFRAA >90 12/09/2011 0532   No results found for: "CHOL", "HDL", "LDLCALC", "LDLDIRECT", "TRIG", "CHOLHDL" No results found for: "HGBA1C" No results found for: "VITAMINB12"    ASSESSMENT AND PLAN 83 y.o. year old male  has a past medical history of CAD (coronary artery disease), native coronary artery (06/2000), Diabetes mellitus type 2, noninsulin dependent (HCC), Dyslipidemia (high LDL; low HDL), Enlarged prostate, GERD (gastroesophageal reflux disease), H/O hiatal hernia, History of bronchitis, History of hematuria, Hypertension, uncontrolled (06/2000), Migraines, OSA on CPAP, PAF (paroxysmal atrial fibrillation) (HCC), Presence of bare metal stent in LAD coronary artery (06/2000), Presence of permanent cardiac pacemaker, and  S/P angioplasty with stent, placement of 2 overlapping DES Xience stents, to prox. Mid Lad and PTCA of distal LAD.  12/08/11 (12/09/2011). here with   No diagnosis found.    Juanpablo A Leipold is doing well on CPAP therapy. Compliance report reveals excellent compliance. AHI is well managed. He was encouraged to continue using CPAP nightly and for greater than 4 hours each night. He will continue melatonin if needed. We will update supply orders as indicated. Risks of untreated sleep apnea review and education materials provided. Healthy lifestyle habits encouraged. He will follow up in 6 months, sooner if needed. He verbalizes understanding and agreement with this plan.    No orders of the defined types were placed in this encounter.    No orders of the defined types were placed in this encounter.     Shawnie Dapper, FNP-C 07/19/2023, 9:31  AM [facility] [address] [place], [place] [address] [phone]

## 2023-07-20 ENCOUNTER — Ambulatory Visit: Payer: Medicare PPO | Admitting: Family Medicine

## 2023-07-20 ENCOUNTER — Encounter: Payer: Self-pay | Admitting: Family Medicine

## 2023-07-20 VITALS — BP 166/83 | HR 60 | Ht 71.5 in | Wt 208.5 lb

## 2023-07-20 DIAGNOSIS — G4733 Obstructive sleep apnea (adult) (pediatric): Secondary | ICD-10-CM | POA: Diagnosis not present

## 2023-07-20 DIAGNOSIS — F5104 Psychophysiologic insomnia: Secondary | ICD-10-CM | POA: Diagnosis not present

## 2023-07-21 ENCOUNTER — Telehealth: Payer: Self-pay

## 2023-07-21 NOTE — Telephone Encounter (Signed)
 Epic msg sent for cpap to adapt;

## 2023-07-26 DIAGNOSIS — M5416 Radiculopathy, lumbar region: Secondary | ICD-10-CM | POA: Diagnosis not present

## 2023-07-28 DIAGNOSIS — H9193 Unspecified hearing loss, bilateral: Secondary | ICD-10-CM | POA: Diagnosis not present

## 2023-07-28 DIAGNOSIS — E1169 Type 2 diabetes mellitus with other specified complication: Secondary | ICD-10-CM | POA: Diagnosis not present

## 2023-07-28 DIAGNOSIS — E782 Mixed hyperlipidemia: Secondary | ICD-10-CM | POA: Diagnosis not present

## 2023-07-28 DIAGNOSIS — I10 Essential (primary) hypertension: Secondary | ICD-10-CM | POA: Diagnosis not present

## 2023-08-24 ENCOUNTER — Other Ambulatory Visit: Payer: Self-pay | Admitting: Rehabilitation

## 2023-08-24 DIAGNOSIS — S32010A Wedge compression fracture of first lumbar vertebra, initial encounter for closed fracture: Secondary | ICD-10-CM | POA: Diagnosis not present

## 2023-08-24 DIAGNOSIS — M5412 Radiculopathy, cervical region: Secondary | ICD-10-CM

## 2023-08-24 DIAGNOSIS — M4722 Other spondylosis with radiculopathy, cervical region: Secondary | ICD-10-CM | POA: Diagnosis not present

## 2023-08-24 DIAGNOSIS — M542 Cervicalgia: Secondary | ICD-10-CM | POA: Diagnosis not present

## 2023-08-24 DIAGNOSIS — M5416 Radiculopathy, lumbar region: Secondary | ICD-10-CM | POA: Diagnosis not present

## 2023-08-25 DIAGNOSIS — Z7901 Long term (current) use of anticoagulants: Secondary | ICD-10-CM | POA: Diagnosis not present

## 2023-08-25 DIAGNOSIS — E782 Mixed hyperlipidemia: Secondary | ICD-10-CM | POA: Diagnosis not present

## 2023-08-25 DIAGNOSIS — Z955 Presence of coronary angioplasty implant and graft: Secondary | ICD-10-CM | POA: Diagnosis not present

## 2023-08-25 DIAGNOSIS — R002 Palpitations: Secondary | ICD-10-CM | POA: Diagnosis not present

## 2023-08-25 DIAGNOSIS — I480 Paroxysmal atrial fibrillation: Secondary | ICD-10-CM | POA: Diagnosis not present

## 2023-08-25 DIAGNOSIS — I25811 Atherosclerosis of native coronary artery of transplanted heart without angina pectoris: Secondary | ICD-10-CM | POA: Diagnosis not present

## 2023-08-25 DIAGNOSIS — I10 Essential (primary) hypertension: Secondary | ICD-10-CM | POA: Diagnosis not present

## 2023-08-25 DIAGNOSIS — I2510 Atherosclerotic heart disease of native coronary artery without angina pectoris: Secondary | ICD-10-CM | POA: Diagnosis not present

## 2023-09-05 DIAGNOSIS — H524 Presbyopia: Secondary | ICD-10-CM | POA: Diagnosis not present

## 2023-09-05 DIAGNOSIS — I480 Paroxysmal atrial fibrillation: Secondary | ICD-10-CM | POA: Diagnosis not present

## 2023-09-05 DIAGNOSIS — E119 Type 2 diabetes mellitus without complications: Secondary | ICD-10-CM | POA: Diagnosis not present

## 2023-09-05 DIAGNOSIS — H353132 Nonexudative age-related macular degeneration, bilateral, intermediate dry stage: Secondary | ICD-10-CM | POA: Diagnosis not present

## 2023-09-06 NOTE — Discharge Instructions (Signed)

## 2023-09-07 ENCOUNTER — Ambulatory Visit
Admission: RE | Admit: 2023-09-07 | Discharge: 2023-09-07 | Disposition: A | Discharge status: Home / Self Care | Source: Ambulatory Visit | Attending: Rehabilitation | Admitting: Rehabilitation

## 2023-09-07 DIAGNOSIS — M5412 Radiculopathy, cervical region: Secondary | ICD-10-CM

## 2023-09-07 DIAGNOSIS — M50122 Cervical disc disorder at C5-C6 level with radiculopathy: Secondary | ICD-10-CM | POA: Diagnosis not present

## 2023-09-07 DIAGNOSIS — M4802 Spinal stenosis, cervical region: Secondary | ICD-10-CM | POA: Diagnosis not present

## 2023-09-07 DIAGNOSIS — M4722 Other spondylosis with radiculopathy, cervical region: Secondary | ICD-10-CM | POA: Diagnosis not present

## 2023-09-07 MED ORDER — DIAZEPAM 5 MG PO TABS: 5.0000 mg | ORAL_TABLET | Freq: Once | ORAL | Status: DC

## 2023-09-07 MED ORDER — MEPERIDINE HCL 50 MG/ML IJ SOLN: 50.0000 mg | Freq: Once | INTRAMUSCULAR | Status: DC | PRN

## 2023-09-07 MED ORDER — ONDANSETRON HCL 4 MG/2ML IJ SOLN: 4.0000 mg | Freq: Once | INTRAMUSCULAR | Status: DC | PRN

## 2023-09-07 MED ORDER — IOPAMIDOL (ISOVUE-M 300) INJECTION 61%
10.0000 mL | Freq: Once | INTRAMUSCULAR | Status: AC | PRN
  Administered 2023-09-07: 10 mL via INTRATHECAL

## 2023-09-20 DIAGNOSIS — M4722 Other spondylosis with radiculopathy, cervical region: Secondary | ICD-10-CM | POA: Diagnosis not present

## 2023-09-28 DIAGNOSIS — Z45010 Encounter for checking and testing of cardiac pacemaker pulse generator [battery]: Secondary | ICD-10-CM | POA: Diagnosis not present

## 2023-09-28 DIAGNOSIS — I480 Paroxysmal atrial fibrillation: Secondary | ICD-10-CM | POA: Diagnosis not present

## 2023-10-19 DIAGNOSIS — M5412 Radiculopathy, cervical region: Secondary | ICD-10-CM | POA: Diagnosis not present

## 2023-10-19 NOTE — Progress Notes (Signed)
 Follow Up Clinic Note - Neck Pain  SUBJECTIVE [name] is a 83 y.o. male who presents to clinic with neck pain. History of Present Illness  .  Patient denies fevers, chills, loss of bowel and bladder control, saddle anesthesia, unintentional weight loss, night sweats.    OBJECTIVE  Vitals:   10/19/23 1138  BP: (!) 161/78  Pulse: 60    Physical Exam Active ROM - Description: active painful range of motion, Restriction: extension: mild restriction. Inspection - Maximum tenderness: lower paraspinal musculature. Sensation - Ulnar hand - Left: Decreased. BUE ROM WNL decreased sensation light touch small and ring finger no gross weakness or atrophy    PROCEDURE PERFORMED: C7-T1 interlaminar epidural steroid injection.   REASON FOR PROCEDURES:  Neck and radicular pain.   SURGEON:   Debby Kansky, M.D.   PROCEDURE: After obtaining informed consent, the patient was placed in the prone position on the fluoroscopy table. Blood pressure, pulse, and pulse oximetry was monitored before and after the procedure. The posterior cervical skin was prepped with Chloraprep and draped in a sterile fashion.   After raising a skin wheal with 1% Lidocaine  and anesthetizing the subcutaneous tissues, a #20 gauge 3.5 inch Tuohy needle was placed under fluoroscopic control into the C7-T1 interlaminar space and then advanced anteriorly towards the posterior epidural space using the loss of resistance technique and monitoring progress in AP and contralateral oblique views.  Approximately 2 mL of 240 mg/mL Omnipaque  contrast dye was injected, demonstrating posterior epidural spread without vascular uptake in both views. Aspiration was negative for blood or CSF. Subsequent to this, 1.5 mL of 10 mg/mL of dexamethasone  and 1.5 cc of sterile saline was instilled. Key fluoroscopic images of the procedure were saved to a local server.  The patient tolerated the procedure well and was transitioned to the  post procedure holding area uneventfully.  The patient was monitored for adverse allergic, paralytic and hypertensive reactions. The patient was discharged without complication.  ASSESSMENT [name] is a 83 y.o. male who presents to clinic with worsening neck pain and progressive LUE extremity symptoms, with pain and paresthesias into his ulnar forearm and small and ring finger. The pain seems to come through his left scapular region and down the arm. Some vague and achy neck pain. Worse with sleep and waking him up. Elbow positioning does not change symptoms and finds he has to sit up to relieve symptoms. Gabapentin provides some relief for restful sleep. C-SPine films 08/24/23 show multilevel DDD, with disc height loss and severe spondylosis at multiple levels and mild retrolisthesis C5 on C6.  Findings on exam consistent with cervical radiculopathy in C8 distribution.  CT myelogram 09/07/23 shows advanced cervical disc and facet degneration with moderate to severe spinal stenosis at C6-7 and moderate at C5-6. Widespread neural foraminal stenosis with left sided stenosis most severe at C3-4 and C6-7. Trace C7-T1 anterolisthesis and severe facet arthrosis and moderate left neural foraminal stenosis   C7-T1 ILESI this date.   PLAN Follow up in about 4 weeks (around 11/16/2023) for post injection.

## 2023-11-14 DIAGNOSIS — M5013 Cervical disc disorder with radiculopathy, cervicothoracic region: Secondary | ICD-10-CM | POA: Diagnosis not present

## 2023-11-14 NOTE — Progress Notes (Signed)
 Subjective:    [name] is a 83 y.o. (DOB 06-06-1940) male.     Patient presents with  . Spine - Pain     HPI 83 year old male with history of cervical pain as well as cervical spondylosis with radiculopathy.  He is more recently had a presentation of progressive left upper extremity symptoms with pain and paresthesias into his ulnar forearm as well as a small and ring finger.  Patient reports to have some vague and achy neck pain itself.  No recent injury.  Worse with sleep but wakes him up.  Elbow positioning really did not seem to change his symptoms and he finds he has to set up to relieve them.  He has been using gabapentin with some relief for sleep.  C-spine films 08/24/2023 showed multilevel degenerative disc disease with disc height loss and severe spondylosis at multiple levels and a mild retrolisthesis at C5 on C6. CT myelogram 09/07/2023 showed advanced cervical disc and facet degeneration with moderate to severe spinal stenosis at C6-7 and moderate at C5-6.  Widespread neuroforaminal stenosis and left-sided stenosis most severe at C3-4 and C6-7.  Trace C7-T1 anterior listhesis and severe facet arthrosis and moderate left neural foraminal stenosis.  C7-T1 IL ESI completed on 10/19/2023 with reports essentially complete relief of both the cervical as well as radicular pain.  He is extremely pleased with results of the injection.  He has been able to get back to doing the activities he enjoys doing.  Reviewed and updated this visit by provider: Tobacco  Allergies  Meds  Problems  Med Hx  Surg Hx  Fam Hx       Review of Systems  Constitutional:  Negative for chills and fever.  Respiratory:  Negative for chest tightness and shortness of breath.   Cardiovascular:  Negative for chest pain.  Gastrointestinal:  Negative for abdominal pain.  Genitourinary:  Negative for difficulty urinating.  Musculoskeletal:  Negative for back pain and gait problem.      Objective:    Vitals:   11/14/23 0915  Height: 5' 11 (1.803 m)  Weight: 208 lb (94.3 kg)  BMI (Calculated): 29  PainSc: 0-No pain  PainLoc: Back   Physical Exam On examination, patient is alert and pleasant.  He ambulates into the office today bearing weight.  Slightly stiff and antalgic gait but within normal limits for his age.  Close examination of upper extremities revealed no breaks in the skin or muscle infection, excess warmth or erythema over the cervical spine.  He has good reports of normal sensation throughout the upper extremity and good grip strength in his hand.  No significant radicular symptoms.  Some mild limited range of motion of the cervical spine in terms of rotational movements and extension but otherwise within normal limits.  Nonpainful.     Assessment / Plan:   Assessment 1. 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 Cervicalgia    Plan  Excellent response to his recent C7-T1 IL ESI with almost complete relief of symptoms.  He is extremely pleased.  Hopefully will have good lasting relief from this injection but he understands he can repeat it as needed.  We discussed some activity restrictions and modifications that he can make as well as some local treatment.  He is happy with that plan.  Follow-up in the future as needs dictate.  Risks, benefits, and alternatives of the medications and treatment plan prescribed today were discussed, and patient expressed understanding. Plan follow-up as  discussed or as needed if any worsening symptoms or change in condition.    Total time spent with patient was greater than 20 minutes at today's visit, including history and physical exam, ordering medications, review of previous OV notes and imaging, review of advanced imaging, assessment, plan of care, and documentation.  Questions were encouraged and answered. Continue current medications as discussed above. Continue current HEP.

## 2023-11-23 DIAGNOSIS — N476 Balanoposthitis: Secondary | ICD-10-CM | POA: Diagnosis not present

## 2023-11-23 DIAGNOSIS — N401 Enlarged prostate with lower urinary tract symptoms: Secondary | ICD-10-CM | POA: Diagnosis not present

## 2023-11-23 DIAGNOSIS — R351 Nocturia: Secondary | ICD-10-CM | POA: Diagnosis not present

## 2023-11-24 DIAGNOSIS — G4733 Obstructive sleep apnea (adult) (pediatric): Secondary | ICD-10-CM | POA: Diagnosis not present

## 2024-01-03 DIAGNOSIS — L570 Actinic keratosis: Secondary | ICD-10-CM | POA: Diagnosis not present

## 2024-01-03 DIAGNOSIS — L578 Other skin changes due to chronic exposure to nonionizing radiation: Secondary | ICD-10-CM | POA: Diagnosis not present

## 2024-01-04 DIAGNOSIS — G4733 Obstructive sleep apnea (adult) (pediatric): Secondary | ICD-10-CM | POA: Diagnosis not present

## 2024-01-04 DIAGNOSIS — Z950 Presence of cardiac pacemaker: Secondary | ICD-10-CM | POA: Diagnosis not present

## 2024-01-04 DIAGNOSIS — I10 Essential (primary) hypertension: Secondary | ICD-10-CM | POA: Diagnosis not present

## 2024-01-04 DIAGNOSIS — Z955 Presence of coronary angioplasty implant and graft: Secondary | ICD-10-CM | POA: Diagnosis not present

## 2024-01-04 DIAGNOSIS — I495 Sick sinus syndrome: Secondary | ICD-10-CM | POA: Diagnosis not present

## 2024-01-04 NOTE — Progress Notes (Signed)
 Patient Name: [name]  MR#: [id number] 83 y.o. male [employer]  01/04/2024  PCP:  Carlin Dale Gull, MD  Referring Provider:  Carlin Dale Gull, MD  Chief Complaint:  Chief Complaint  Patient presents with  . Device Check    Assessment & Plan Obstructive sleep apnea syndrome in adult  Tachycardia-bradycardia [facility])  Essential hypertension  S/P coronary artery stent placement  Pacemaker reprogramming/check Normal Device Function  Rhythms Clinically Stable Occas. Asx PMT - extened the PVAR 50 ms. Minimal RV pacing    -  Follow up 1 year, remote monitoring on a quarterly basis, as per protocol.  Encouraged follow up with PCP and Cardiologist.  See Scanned data/ .pdf for details    Assessment & Plan Summary: Assessment & Plan 1. Atrial fibrillation: - Monitor symptoms and heart rate. - Pacemaker functioning normally, occasional backwards conversation managed by device. - Investigate to prevent future occurrences.  2. Chronic coronary disease: - No new symptoms. - Continue management and monitoring.  3. Essential hypertension: - No new symptoms. - Continue management and monitoring.  4. Tachybrady syndrome: - Pacemaker functioning normally, occasional backwards conversation managed by device. - Investigate to prevent future occurrences.  5. Diabetes: - No new symptoms. - Continue management and monitoring.  6. Obstructive sleep apnea: - No new symptoms. - Continue management and monitoring.  Follow-up: 04/2024.   Summary of Orders Placed:  No orders of the defined types were placed in this encounter.   Subjective:   Mareo A Scales  History of Present Illness The patient is an 83 year old male with a medical history that includes diabetes, Zenker's diverticulum, obstructive sleep apnea, atrial fibrillation, chronic coronary disease, essential hypertension, tachybrady syndrome, and an old stent. He has a dual chamber [employer]. Jude  pacemaker that was implanted by Dr. Janeal in 2021. Recently, he resumed follow-up care.  The patient reports feeling well overall and works daily without experiencing any heart-related issues. He remains active through his daily activities, although he does not engage in formal exercise.  SOCIAL HISTORY Occupations: Works on a rent house, previously a museum/gallery curator for 31 years. Exercise: Active through daily tasks, no formal exercise. Alcohol : None. Tobacco: Raised tobacco in the past, current use not mentioned. Recreational Drugs: Tried marijuana once.   Review of Systems  Pertinent items are noted in HPI.     Objective:   See Scanned data/ .pdf for detail regarding tests and studies     Vital Signs  BP 133/78   Pulse 85   Wt 94.3 kg (208 lb)   SpO2 93%   BMI 29.01 kg/m  Body mass index is 29.01 kg/m.  Wt Readings from Last 3 Encounters:  01/04/24 94.3 kg (208 lb)  08/25/23 94.6 kg (208 lb 9.6 oz)  05/30/23 90.7 kg (200 lb)    Physical Exam   Physical Exam:   (Vitals above) Largely Unremarkable/unchanged Physical Exam  General: NAD, appropriate affect   Heart: RRR Normal S1, S2  no murmur.  NO gallop, or rub Lungs: CTA Abdomen: Benign            Extremities: No significant edema and no clubbing, cyanosis  Skin: Pink, warm, and dry Neurologic: A&Ox4,  No focal or lateralizing deficits.     Lab Review   Sodium  Date/Time Value Ref Range Status  05/31/2023 07:04 AM 135 (L) 136 - 145 mmol/L Final   Potassium  Date/Time Value Ref Range Status  05/31/2023 07:04 AM 4.1 3.5 -  5.1 mmol/L Final    Comment:    NO VISIBLE HEMOLYSIS   HX MAGNESIUM  Date/Time Value Ref Range Status  02/04/2020 06:08 AM 1.9 1.8 - 2.4 MG/DL Final    Comment:    Patients taking eltrombopag at doses >/= 100 mg daily may show falsely elevated values of 10% or greater.   Blood Urea Nitrogen (BUN)  Date/Time Value Ref Range Status  05/31/2023 07:04 AM 18 7 - 25 mg/dL Final    Creatinine  Date/Time Value Ref Range Status  05/31/2023 07:04 AM 0.95 0.70 - 1.30 mg/dL Final   Hemoglobin  Date/Time Value Ref Range Status  05/31/2023 07:04 AM 12.7 (L) 14.0 - 17.5 g/dL Final   Hematocrit  Date/Time Value Ref Range Status  05/31/2023 07:04 AM 38.9 (L) 41.5 - 50.4 % Final   Platelet Count (PLT)  Date/Time Value Ref Range Status  05/31/2023 07:04 AM 273 150 - 450 10*3/uL Final     The ASCVD Risk score (Arnett DK, et al., 2019) failed to calculate for the following reasons:   The 2019 ASCVD risk score is only valid for ages 61 to 45  Medical History  Problem List[1]  Social History  Tobacco Use History[2]  Medications  Current Medications[3]  Allergies  Allergies[4] Surgical History[5] The above discussed in length with patient, all questions answered. If any changes/questions/concerns patient will call.  I have personally spent >20 minutes involved in face-to-face and non-face-to-face activities for this patient on the day of the visit.  Professional time spent includes chart review and communication with the team involved in the patient's care, in addition to those noted in the documentation. Morene Quan Strag, PA-C PA-C       [1] Patient Active Problem List Diagnosis  . Atrial fibrillation (HCC)  . Chronic cough  . Diabetes mellitus (HCC)  . Hiatal hernia  . Obstructive sleep apnea syndrome in adult  . Chronic coronary artery disease  . Palpitation  . Essential hypertension  . Hyperlipemia  . Allergic rhinitis  . Diverticulum of esophagus  . S/P coronary artery stent placement  . Afib (HCC)  . Pacemaker ---- STJ/ ABBOTT --------  . Tachycardia-bradycardia (HCC)  . Palpitations  . Mucoid cyst of joint  . Osteoarthritis of finger of left hand  . Angina pectoris  . Dysphagia  . Zenker diverticulum  . Chronic anticoagulation  [2] Social History Tobacco Use  Smoking Status Never  Smokeless Tobacco Never  [3] Current  Outpatient Medications  Medication Sig Dispense Refill  . apixaban (Eliquis) 5 mg tab TAKE 1 TABLET BY MOUTH TWICE A DAY 180 tablet 3  . aspirin  81 mg EC tablet Take 81 mg by mouth Once Daily. Indications: treatment to prevent a heart attack 90 tablet 3  . atenoloL  (TENORMIN ) 50 mg tablet TAKE 1 TABLET BY MOUTH TWICE A DAY 180 tablet 3  . coenzyme Q-10 200 mg capsule Take 200 mg by mouth Once Daily.    SABRA gabapentin (NEURONTIN) 300 mg capsule Take 300 mg by mouth daily.    . glipiZIDE  (GLUCOTROL ) 10 mg tablet Take 10 mg by mouth 2 (two) times a day before meals.    . Jardiance 25 mg tab TAKE 1 TABLET BY MOUTH EVERY DAY  12  . metFORMIN (GLUCOPHAGE) 500 mg tablet Take 500 mg by mouth 2 (two) times a day with meals. Indications: type 2 diabetes mellitus    . nystatin-triamcinolone (MYCOLOG II) 100,000-0.1 unit/g-% cream 1 DROP TRANSDERMAL TWICE A DAY AS NEEDED    .  OneTouch Ultra Test test strip     . pantoprazole  (PROTONIX ) 40 mg EC tablet TAKE 1 TABLET BY MOUTH TWICE A DAY 180 tablet 3  . rosuvastatin (CRESTOR) 20 mg tablet TAKE 1 TABLET BY MOUTH EVERY DAY AT NIGHT 90 tablet 3  . SITagliptin phosphate (Januvia) 100 mg tablet Take 100 mg by mouth Once Daily.    SABRA telmisartan (MICARDIS) 80 mg tab tablet Take 1 tablet (80 mg total) by mouth daily. 90 tablet 3   No current facility-administered medications for this visit.  [4] Allergies Allergen Reactions  . Dopamine Palpitations  . Fish Oil Other (See Comments)    Hematuria   . Ticagrelor  Respiratory Distress and Anaphylaxis  . Warfarin Sodium Other (See Comments)    Hematuria   . Tetanus And Diphtheria Toxoids Swelling    Oraserum Tetanus  . Amiodarone Analogues Other (See Comments)    Pt states make him feel terrible  . Trazodone  Hcl Other (See Comments)  . Bee Pollen Other (See Comments)    Stuffy nose  . Pollen Extracts Other (See Comments)    Stuffy nose  [5] Past Surgical History: Procedure Laterality Date  . ABLATION OF  DYSRHYTHMIC FOCUS     Procedure: ABLATION OF DYSRHYTHMIC FOCUS  . APPENDECTOMY     Procedure: APPENDECTOMY  . BACK SURGERY     Procedure: BACK SURGERY  . CARDIAC CATHETERIZATION     Procedure: CARDIAC CATHETERIZATION; stent -LAD  . CARDIAC ELECTROPHYSIOLOGY MAPPING AND ABLATION     Procedure: CARDIAC ELECTROPHYSIOLOGY MAPPING AND ABLATION  . CATARACT EXTRACTION, BILATERAL     Procedure: CATARACT EXTRACTION, BILATERAL  . CHOLECYSTECTOMY     Procedure: CHOLECYSTECTOMY  . COLONOSCOPY     Procedure: COLONOSCOPY  . ENDOSCOPY     Procedure: ENDOSCOPY  . LARYNGOSCOPY Bilateral 05/30/2023   MICROLARYNGOSCOPY, rigid (flexible) esophagscopy, endoscopic cricopharyngeal myotomy w/ stapler or laser, balloon dilation, Botox injection to cricopharyngeus, TNE performed by Mirna Anette Cohens, DO at [facility] OR  . PACEMAKER LEAD REPLACEMENT     Procedure: PACEMAKER LEAD PLACEMENT  . TONSILLECTOMY AND ADENOIDECTOMY     Procedure: TONSILLECTOMY AND ADENOIDECTOMY

## 2024-01-31 DIAGNOSIS — Z01110 Encounter for hearing examination following failed hearing screening: Secondary | ICD-10-CM | POA: Diagnosis not present

## 2024-02-27 DIAGNOSIS — L02212 Cutaneous abscess of back [any part, except buttock]: Secondary | ICD-10-CM | POA: Diagnosis not present

## 2024-02-29 DIAGNOSIS — Z0000 Encounter for general adult medical examination without abnormal findings: Secondary | ICD-10-CM | POA: Diagnosis not present

## 2024-02-29 DIAGNOSIS — E782 Mixed hyperlipidemia: Secondary | ICD-10-CM | POA: Diagnosis not present

## 2024-02-29 DIAGNOSIS — K219 Gastro-esophageal reflux disease without esophagitis: Secondary | ICD-10-CM | POA: Diagnosis not present

## 2024-02-29 DIAGNOSIS — Z6829 Body mass index (BMI) 29.0-29.9, adult: Secondary | ICD-10-CM | POA: Diagnosis not present

## 2024-02-29 DIAGNOSIS — I10 Essential (primary) hypertension: Secondary | ICD-10-CM | POA: Diagnosis not present

## 2024-02-29 DIAGNOSIS — E1169 Type 2 diabetes mellitus with other specified complication: Secondary | ICD-10-CM | POA: Diagnosis not present

## 2024-02-29 DIAGNOSIS — M5416 Radiculopathy, lumbar region: Secondary | ICD-10-CM | POA: Diagnosis not present

## 2024-02-29 DIAGNOSIS — Z950 Presence of cardiac pacemaker: Secondary | ICD-10-CM | POA: Diagnosis not present

## 2024-02-29 DIAGNOSIS — Z7901 Long term (current) use of anticoagulants: Secondary | ICD-10-CM | POA: Diagnosis not present

## 2024-04-02 ENCOUNTER — Emergency Department (HOSPITAL_BASED_OUTPATIENT_CLINIC_OR_DEPARTMENT_OTHER)
Admission: EM | Admit: 2024-04-02 | Discharge: 2024-04-02 | Disposition: A | Discharge status: Home / Self Care | Attending: Emergency Medicine | Admitting: Emergency Medicine

## 2024-04-02 ENCOUNTER — Other Ambulatory Visit: Payer: Self-pay

## 2024-04-02 ENCOUNTER — Emergency Department (HOSPITAL_BASED_OUTPATIENT_CLINIC_OR_DEPARTMENT_OTHER): Admitting: Radiology

## 2024-04-02 ENCOUNTER — Encounter (HOSPITAL_BASED_OUTPATIENT_CLINIC_OR_DEPARTMENT_OTHER): Payer: Self-pay

## 2024-04-02 DIAGNOSIS — I2510 Atherosclerotic heart disease of native coronary artery without angina pectoris: Secondary | ICD-10-CM | POA: Insufficient documentation

## 2024-04-02 DIAGNOSIS — I4891 Unspecified atrial fibrillation: Secondary | ICD-10-CM | POA: Diagnosis not present

## 2024-04-02 DIAGNOSIS — Z950 Presence of cardiac pacemaker: Secondary | ICD-10-CM | POA: Insufficient documentation

## 2024-04-02 DIAGNOSIS — R002 Palpitations: Secondary | ICD-10-CM | POA: Diagnosis not present

## 2024-04-02 DIAGNOSIS — I3481 Nonrheumatic mitral (valve) annulus calcification: Secondary | ICD-10-CM | POA: Diagnosis not present

## 2024-04-02 DIAGNOSIS — I10 Essential (primary) hypertension: Secondary | ICD-10-CM | POA: Insufficient documentation

## 2024-04-02 DIAGNOSIS — R0602 Shortness of breath: Secondary | ICD-10-CM | POA: Diagnosis not present

## 2024-04-02 DIAGNOSIS — Z7982 Long term (current) use of aspirin: Secondary | ICD-10-CM | POA: Insufficient documentation

## 2024-04-02 DIAGNOSIS — Z79899 Other long term (current) drug therapy: Secondary | ICD-10-CM | POA: Insufficient documentation

## 2024-04-02 DIAGNOSIS — Z7901 Long term (current) use of anticoagulants: Secondary | ICD-10-CM | POA: Insufficient documentation

## 2024-04-02 DIAGNOSIS — R011 Cardiac murmur, unspecified: Secondary | ICD-10-CM | POA: Insufficient documentation

## 2024-04-02 LAB — CBC WITH DIFFERENTIAL/PLATELET
Abs Immature Granulocytes: 0.02 K/uL (ref 0.00–0.07)
Basophils Absolute: 0 K/uL (ref 0.0–0.1)
Basophils Relative: 0 %
Eosinophils Absolute: 0.1 K/uL (ref 0.0–0.5)
Eosinophils Relative: 1 %
HCT: 45.3 % (ref 39.0–52.0)
Hemoglobin: 14.1 g/dL (ref 13.0–17.0)
Immature Granulocytes: 0 %
Lymphocytes Relative: 5 %
Lymphs Abs: 0.5 K/uL — ABNORMAL LOW (ref 0.7–4.0)
MCH: 21.9 pg — ABNORMAL LOW (ref 26.0–34.0)
MCHC: 31.1 g/dL (ref 30.0–36.0)
MCV: 70.2 fL — ABNORMAL LOW (ref 80.0–100.0)
Monocytes Absolute: 0.6 K/uL (ref 0.1–1.0)
Monocytes Relative: 6 %
Neutro Abs: 9.2 K/uL — ABNORMAL HIGH (ref 1.7–7.7)
Neutrophils Relative %: 88 %
Platelets: 403 K/uL — ABNORMAL HIGH (ref 150–400)
RBC: 6.45 MIL/uL — ABNORMAL HIGH (ref 4.22–5.81)
RDW: 18.1 % — ABNORMAL HIGH (ref 11.5–15.5)
WBC: 10.4 K/uL (ref 4.0–10.5)
nRBC: 0 % (ref 0.0–0.2)

## 2024-04-02 LAB — RESP PANEL BY RT-PCR (RSV, FLU A&B, COVID)  RVPGX2
Influenza A by PCR: NEGATIVE
Influenza B by PCR: NEGATIVE
Resp Syncytial Virus by PCR: NEGATIVE
SARS Coronavirus 2 by RT PCR: NEGATIVE

## 2024-04-02 LAB — COMPREHENSIVE METABOLIC PANEL WITH GFR
ALT: 12 U/L (ref 0–44)
AST: 20 U/L (ref 15–41)
Albumin: 4.6 g/dL (ref 3.5–5.0)
Alkaline Phosphatase: 77 U/L (ref 38–126)
Anion gap: 15 (ref 5–15)
BUN: 19 mg/dL (ref 8–23)
CO2: 22 mmol/L (ref 22–32)
Calcium: 10 mg/dL (ref 8.9–10.3)
Chloride: 99 mmol/L (ref 98–111)
Creatinine, Ser: 1.17 mg/dL (ref 0.61–1.24)
GFR, Estimated: >60 mL/min (ref >60)
Glucose, Bld: 150 mg/dL — ABNORMAL HIGH (ref 70–99)
Potassium: 4.4 mmol/L (ref 3.5–5.1)
Sodium: 136 mmol/L (ref 135–145)
Total Bilirubin: 0.8 mg/dL (ref 0.0–1.2)
Total Protein: 7.7 g/dL (ref 6.5–8.1)

## 2024-04-02 LAB — TROPONIN T, HIGH SENSITIVITY
Troponin T High Sensitivity: 16 ng/L (ref 0–19)
Troponin T High Sensitivity: <15 ng/L (ref 0–19)

## 2024-04-02 LAB — PRO BRAIN NATRIURETIC PEPTIDE: Pro Brain Natriuretic Peptide: 427 pg/mL — ABNORMAL HIGH (ref <300.0)

## 2024-04-02 NOTE — ED Triage Notes (Addendum)
 Pt states he has been feeling bad all day. SHOB has increased all day Slight CP Did use his CP machine this afternoon and felt some better +nausea

## 2024-04-02 NOTE — ED Notes (Signed)
 Pt seen in triage for shortness of breath. Upon arrival pt speaking in full sentences without complication, SpO2 98% on room air. Clear/diminished bilateral breath sounds. Pt endorses a non productive cough. Pt states briefly used his AutoPAP (7-17cmH2O) machine at home with some relief. RT will continue to be available as needed.

## 2024-04-02 NOTE — ED Provider Notes (Signed)
 [name] Provider Note   CSN: [id number] Arrival date & time: 04/02/24  8171     Patient presents with: Shortness of Breath and Palpitations   [name] is a 83 y.o. male.  {Add pertinent medical, surgical, social history, OB history to HPI:8482} 83 year old male history of atrial fibrillation on Eliquis, CAD status post PCI, hypertension, tachybradycardia syndrome status post [facility] Jude pacemaker, and OSA on CPAP who presents emergency department shortness of breath.  Patient reports that today he has been feeling more short of breath than usual.  Has had some chills.  No fevers.  No cough.  Has had some epigastric discomfort as well.  Is not exertional or pleuritic.  Is not positional.  Says that he took a pulse ox at home that was reading 94%.  Was still feeling short of breath and so decided to put out on his CPAP machine which made him feel better this led him to believe that there is something going on with his lungs so he decided to come into the emergency department for evaluation.  No leg swelling recently.  Denies chest pain otherwise.  No palpitations.        Prior to Admission medications   Medication Sig Start Date End Date Taking? Authorizing Provider  apixaban (ELIQUIS) 5 MG TABS tablet Take 5 mg by mouth 2 (two) times daily.    [provider]  aspirin  EC 81 MG tablet Take 81 mg by mouth daily.    [provider]  atenolol  (TENORMIN ) 50 MG tablet Take 1 tablet by mouth 2 (two) times daily. 02/15/11   [provider]  Coenzyme Q10 (CO Q-10) 200 MG CAPS Take 1 tablet by mouth daily.    [provider]  empagliflozin (JARDIANCE) 25 MG TABS tablet TAKE 1 TABLET BY MOUTH EVERY DAY 09/26/17   [provider]  finasteride (PROSCAR) 5 MG tablet Take 1 tablet by mouth daily. 08/09/20   [provider]  gabapentin (NEURONTIN) 300 MG capsule Take 300 mg by mouth 2 (two) times daily.  07/11/23   [provider]  glipiZIDE  (GLUCOTROL ) 10 MG tablet Take 10 mg by mouth 2 (two) times daily before a meal.    [provider]  melatonin 5 MG TABS Take 5 mg by mouth at bedtime as needed.    [provider]  metFORMIN (GLUCOPHAGE) 500 MG tablet Take 1,000 mg by mouth 2 (two) times daily with a meal. Was on hold due to procedure    [provider]  Multiple Vitamins-Minerals (PRESERVISION AREDS PO) Take 1 tablet by mouth daily.    [provider]  nystatin ointment (MYCOSTATIN) Apply 1 Application topically 2 (two) times daily.    [provider]  rosuvastatin (CRESTOR) 20 MG tablet Take 20 mg by mouth daily.    [provider]  sitaGLIPtin (JANUVIA) 100 MG tablet Take 100 mg by mouth daily.    [provider]  telmisartan (MICARDIS) 80 MG tablet Take 40 mg by mouth daily.    [provider]  triamcinolone (KENALOG) 0.025 % ointment Apply 1 Application topically 2 (two) times daily.    [provider]    Allergies: Brilinta  [ticagrelor ], Dopamine, Tetanus toxoid-containing vaccines, Warfarin sodium, and Fish oil    Review of Systems  Updated Vital Signs BP 120/66 (BP Location: Right Arm)   Pulse 67   Temp (!) 97.5 F (36.4 C)   Resp 18   Ht 5'  11.5 (1.816 m)   Wt 90.7 kg   SpO2 98%   BMI 27.51 kg/m   Physical Exam Vitals and nursing note reviewed.  Constitutional:      General: He is not in acute distress.    Appearance: He is well-developed.  HENT:     Head: Normocephalic and atraumatic.     Right Ear: External ear normal.     Left Ear: External ear normal.     Nose: Nose normal.  Eyes:     Extraocular Movements: Extraocular movements intact.     Conjunctiva/sclera: Conjunctivae normal.     Pupils: Pupils are equal, round, and reactive to light.  Cardiovascular:     Rate and Rhythm: Normal rate and regular rhythm.     Heart sounds: Murmur (3/6 LSB systolic) heard.   Pulmonary:     Effort: Pulmonary effort is normal. No respiratory distress.     Breath sounds: Normal breath sounds.  Abdominal:     General: There is no distension.     Palpations: Abdomen is soft. There is no mass.     Tenderness: There is no abdominal tenderness. There is no guarding.  Musculoskeletal:     Cervical back: Normal range of motion and neck supple.     Right lower leg: No edema.     Left lower leg: No edema.  Skin:    General: Skin is warm and dry.  Neurological:     Mental Status: He is alert. Mental status is at baseline.  Psychiatric:        Mood and Affect: Mood normal.        Behavior: Behavior normal.     (all labs ordered are listed, but only abnormal results are displayed) Labs Reviewed  COMPREHENSIVE METABOLIC PANEL WITH GFR - Abnormal; Notable for the following components:      Result Value   Glucose, Bld 150 (*)    All other components within normal limits  CBC WITH DIFFERENTIAL/PLATELET - Abnormal; Notable for the following components:   RBC 6.45 (*)    MCV 70.2 (*)    MCH 21.9 (*)    RDW 18.1 (*)    Platelets 403 (*)    Neutro Abs 9.2 (*)    Lymphs Abs 0.5 (*)    All other components within normal limits  RESP PANEL BY RT-PCR (RSV, FLU A&B, COVID)  RVPGX2  PRO BRAIN NATRIURETIC PEPTIDE  TROPONIN T, HIGH SENSITIVITY  TROPONIN T, HIGH SENSITIVITY    EKG: EKG Interpretation Date/Time:  Monday April 02 2024 18:46:05 EST Ventricular Rate:  65 PR Interval:  184 QRS Duration:  128 QT Interval:  394 QTC Calculation: 409 R Axis:   -30  Text Interpretation: Normal sinus rhythm Left axis deviation Right bundle branch block Abnormal ECG When compared with ECG of 21-Mar-2022 08:40, Sinus rhythm has replaced Electronic atrial pacemaker Right bundle branch block is now Present Confirmed by Yolande Charleston [phone]) on 04/02/2024 9:21:36 PM  Radiology: No results found.  {Document cardiac monitor, telemetry assessment procedure when  appropriate:32947} Procedures   Medications Ordered in the ED - No data to display  Clinical Course as of 04/02/24 2316  Mon Apr 02, 2024  2311 Walking pulse ox with nadir of 95%. Did not appear to be winded or short of breath with ambulation.  [RP]    Clinical Course User Index [RP] Yolande Charleston BROCKS, MD   {Click here for ABCD2, HEART and other calculators REFRESH Note before signing:1}  Medical Decision Making Amount and/or Complexity of Data Reviewed Labs: ordered. Radiology: ordered.   [name] is a 83 year old male history of atrial fibrillation on Eliquis, CAD status post PCI, hypertension, tachybradycardia syndrome status post [facility] Jude pacemaker, and OSA on CPAP who presents emergency department shortness of breath.  Initial Ddx:  ***   MDM/Course:  *** Upon re-evaluation ***  This patient presents to the ED for concern of complaints listed in HPI, this involves an extensive number of treatment options, and is a complaint that carries with it a high risk of complications and morbidity. Disposition including potential need for admission considered.   Dispo: {Disposition:28069}  Additional history obtained from {Additional History:28067} Records reviewed {Records Reviewed:28068} The following labs were independently interpreted: {labs interpreted:28064} and show {lab findings:28250} I independently reviewed the following imaging with scope of interpretation limited to determining acute life threatening conditions related to emergency care: {imaging interpreted:28065} and agree with the radiologist interpretation with the following exceptions: none I personally reviewed and interpreted cardiac monitoring: {cardiac monitoring:28251} I personally reviewed and interpreted the pt's EKG: see above for interpretation  I have reviewed the patients home medications and made adjustments as needed Consults: {Consultants:28063} Social Determinants  of health:  ***  Portions of this note were generated with Scientist, clinical (histocompatibility and immunogenetics). Dictation errors may occur despite best attempts at proofreading.     Final diagnoses:  None    ED Discharge Orders     None

## 2024-04-02 NOTE — Discharge Instructions (Addendum)
 You were seen for your shortness of breath and murmur in the emergency department.   At home, please continue your medications.    Check your MyChart online for the results of any tests that had not resulted by the time you left the emergency department.   Follow-up with your primary doctor in 2-3 days regarding your visit.  Talk to your cardiologist to see if you need repeat echocardiogram because of your murmur  Return immediately to the emergency department if you experience any of the following: Difficulty breathing, chest pain, or any other concerning symptoms.    Thank you for visiting our Emergency Department. It was a pleasure taking care of you today.

## 2024-07-19 ENCOUNTER — Ambulatory Visit: Payer: Medicare PPO | Admitting: Family Medicine

## 2024-10-22 ENCOUNTER — Ambulatory Visit: Admitting: Family Medicine
